# Patient Record
Sex: Female | Born: 1995 | Marital: Single | Smoking: Never smoker
Health system: Southern US, Community
[De-identification: ages and names within clinical notes are randomized; demographics above are authoritative.]

## PROBLEM LIST (undated history)

## (undated) DIAGNOSIS — E049 Nontoxic goiter, unspecified: Secondary | ICD-10-CM

## (undated) DIAGNOSIS — L80 Vitiligo: Secondary | ICD-10-CM

## (undated) HISTORY — PX: ADENOIDECTOMY: SHX5191

## (undated) HISTORY — DX: Vitiligo: L80

## (undated) HISTORY — PX: ANKLE SURGERY: SHX546

## (undated) HISTORY — DX: Nontoxic goiter, unspecified: E04.9

---
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
## 2014-02-09 ENCOUNTER — Encounter: Payer: Self-pay | Admitting: Internal Medicine

## 2014-02-09 ENCOUNTER — Encounter: Payer: BC Managed Care – PPO | Admitting: Internal Medicine

## 2014-02-09 DIAGNOSIS — Z0289 Encounter for other administrative examinations: Secondary | ICD-10-CM

## 2014-02-09 NOTE — Patient Instructions (Signed)
Health Maintenance - 18-19 Years Old SCHOOL PERFORMANCE After high school, you may attend college or technical or vocational school, enroll in the military, or enter the workforce. PHYSICAL, SOCIAL, AND EMOTIONAL DEVELOPMENT  One hour of regular physical activity daily is recommended. Continue to participate in sports.  Develop your own interests and consider community service or volunteerism.  Make decisions about college and work plans.  Throughout these years, you should assume responsibility for your own health care. Increasing independence is important for you.  You may be exploring your sexual identity. Understand that you should never be in a situation that makes you feel uncomfortable, and tell your partner if you do not want to engage in sexual activity.  Body image may become important to you. Be mindful that eating disorders can develop at this time. Talk to your parents or other caregivers if you have concerns about body image, weight gain, or losing weight.  You may notice mood disturbances, depression, anxiety, attention problems, or trouble with alcohol. Talk to your health care provider if you have concerns about mental illness.  Set limits for yourself and talk with your parents or other caregivers about independent decision making.  Handle conflict without physical violence.  Avoid loud noises which may impair hearing.  Limit television and computer time to 2 hours each day. Individuals who engage in excessive inactivity are more likely to become overweight. RECOMMENDED IMMUNIZATIONS  Influenza vaccine.  All adults should be immunized every year.  All adults, including pregnant women and people with hives-only allergy to eggs, can receive the inactivated influenza (IIV) vaccine.  Adults aged 19-49 years can receive the recombinant influenza (RIV) vaccine. The RIV vaccine does not contain any egg protein.  Tetanus, diphtheria, and acellular pertussis (Td, Tdap)  vaccine.  Pregnant women should receive 1 dose of Tdap vaccine during each pregnancy. The dose should be obtained regardless of the length of time since the last dose. Immunization is preferred during the 27th to 36th week of gestation.  An adult who has not previously received Tdap or who does not know his or her vaccine status should receive 1 dose of Tdap. This initial dose should be followed by tetanus and diphtheria toxoids (Td) booster doses every 10 years.  Adults with an unknown or incomplete history of completing a 3-dose immunization series with Td-containing vaccines should begin or complete a primary immunization series including a Tdap dose.  Adults should receive a Td booster every 10 years.  Varicella vaccine.  An adult without evidence of immunity to varicella should receive 2 doses or a second dose if he or she has previously received 1 dose.  Pregnant females who do not have evidence of immunity should receive the first dose after pregnancy. This first dose should be obtained before leaving the health care facility. The second dose should be obtained 4-8 weeks after the first dose.  Human papillomavirus (HPV) vaccine.  Females aged 13-19 years who have not received the vaccine previously should obtain the 3-dose series.  The vaccine is not recommended for pregnant females. However, pregnancy testing is not needed before receiving a dose. If a female is found to be pregnant after receiving a dose, no treatment is needed. In that case, the remaining doses should be delayed until after the pregnancy.  Males aged 13-19 years who have not received the vaccine previously should receive the 3-dose series. Males aged 22-26 years may be immunized.  Immunization is recommended through the age of 19 years for any   female who has sex with males and did not get any or all doses earlier.  Immunization is recommended for any person with an immunocompromised condition through the age of 19  years if he or she did not get any or all doses earlier.  During the 3-dose series, the second dose should be obtained 4-8 weeks after the first dose. The third dose should be obtained 24 weeks after the first dose and 16 weeks after the second dose.  Measles, mumps, and rubella (MMR) vaccine.  Adults born in 1957 or later should have 1 or more doses of MMR vaccine unless there is a contraindication to the vaccine or there is laboratory evidence of immunity to each of the three diseases.  A routine second dose of MMR vaccine should be obtained at least 28 days after the first dose for students attending postsecondary schools, health care workers, and international travelers.  For females of childbearing age, rubella immunity should be determined. If there is no evidence of immunity, females who are not pregnant should be vaccinated. If there is no evidence of immunity, females who are pregnant should delay immunization until after pregnancy.  Pneumococcal 13-valent conjugate (PCV13) vaccine.  When indicated, a person who is uncertain of his or her immunization history and has no record of immunization should receive the PCV13 vaccine.  An adult aged 19 years or older who has certain medical conditions and has not been previously immunized should receive 1 dose of PCV13 vaccine. This PCV13 should be followed with a dose of pneumococcal polysaccharide (PPSV23) vaccine. The PPSV23 vaccine dose should be obtained at least 8 weeks after the dose of PCV13 vaccine.  An adult aged 19 years or older who has certain medical conditions and previously received 1 or more doses of PPSV23 vaccine should receive 1 dose of PCV13. The PCV13 vaccine dose should be obtained 1 or more years after the last PPSV23 vaccine dose.  Pneumococcal polysaccharide (PPSV23) vaccine.  When PCV13 is also indicated, PCV13 should be obtained first.  An adult younger than age 65 years who has certain medical conditions should be  immunized.  Any person who resides in a long-term care facility should be immunized.  An adult smoker should be immunized.  People with an immunocompromised condition and certain other conditions should receive both PCV13 and PPSV23 vaccines.  People with human immunodeficiency virus (HIV) infection should be immunized as soon as possible after diagnosis.  Immunization during chemotherapy or radiation therapy should be avoided.  Routine use of PPSV23 vaccine is not recommended for American Indians, [place] Natives, or people younger than 65 years unless there are medical conditions that require PPSV23 vaccine.  When indicated, people who have unknown immunization and have no record of immunization should receive PPSV23 vaccine.  One-time revaccination 5 years after the first dose of PPSV23 is recommended for people aged 19-64 years who have chronic kidney failure, nephrotic syndrome, asplenia, or immunocompromised conditions.  Meningococcal vaccine.  Adults with asplenia or persistent complement component deficiencies should receive 2 doses of quadrivalent meningococcal conjugate (MenACWY-D) vaccine. The doses should be obtained at least 2 months apart.  Microbiologists working with certain meningococcal bacteria, military recruits, people at risk during an outbreak, and people who travel to or live in countries with a high rate of meningitis should be immunized.  A first-year college student up through age 19 years who is living in a residence hall should receive a dose if he or she did not receive a dose on   or after his or her 16th birthday.  Adults who have certain high-risk conditions should receive one or more doses of vaccine.  Hepatitis A vaccine.  Adults who wish to be protected from this disease, have certain high-risk conditions, work with hepatitis A-infected animals, work in hepatitis A research labs, or travel to or work in countries with a high rate of hepatitis A should be  immunized.  Adults who were previously unvaccinated and who anticipate close contact with an international adoptee during the first 60 days after arrival in the [place] from a country with a high rate of hepatitis A should be immunized.  Hepatitis B vaccine.  Adults who wish to be protected from this disease, have certain high-risk conditions, may be exposed to blood or other infectious body fluids, are household contacts or sex partners of hepatitis B positive people, are clients or workers in certain care facilities, or travel to or work in countries with a high rate of hepatitis B should be immunized.  Haemophilus influenzae type b (Hib) vaccine.  A previously unvaccinated person with asplenia or sickle cell disease or having a scheduled splenectomy should receive 1 dose of Hib vaccine.  Regardless of previous immunization, a recipient of a hematopoietic stem cell transplant should receive a 3-dose series 6-12 months after his or her successful transplant.  Hib vaccine is not recommended for adults with HIV infection. TESTING  Annual screening for vision and hearing problems is recommended. Vision should be screened at least once between 18-19 years of age.  You may be screened for anemia or tuberculosis.  You should have a blood test to check for high cholesterol.  You should be screened for alcohol and drug use.  If you are sexually active, you may be screened for sexually transmitted infections (STIs), pregnancy, or HIV. You should be screened for STIs if:  Your sexual activity has changed since the last screening test, and you are at an increased risk for chlamydia or gonorrhea. Ask your health care provider if you are at risk.  If you are at an increased risk for hepatitis B, you should be screened for this virus. You are considered at high risk for hepatitis B if you:  Were born in a country where hepatitis B occurs often. Talk with your health care provider about which  countries are considered high risk.  Have parents who were born in a high-risk country and have not received a shot to protect against hepatitis B (hepatitis B vaccine).  Have HIV or AIDS.  Use needles to inject street drugs.  Live with or have sex with someone who has hepatitis B.  Are a man who has sex with other men (MSM).  Get hemodialysis treatment.  Take certain medicines for conditions like cancer, organ transplantation, or autoimmune conditions. NUTRITION   You should:  Have three servings of low-fat milk and dairy products daily. If you do not drink milk or consume dairy products, you should eat calcium-enriched foods, such as juice, bread, or cereal. Dark, leafy greens or canned fish are alternate sources of calcium.  Drink plenty of water. Fruit juice should be limited to 8-12 oz (240-360 mL) each day. Sugary beverages and sodas should be avoided.  Avoid eating foods high in fat, salt, or sugar, such as chips, candy, and cookies.  Avoid fast foods and limit eating out at restaurants.  Try not to skip meals, especially breakfast. You should eat a variety of vegetables, fruits, and lean meats.  Eat meals   together as a family whenever possible. ORAL HEALTH Brush your teeth twice a day and floss at least once a day. You should have two dental exams a year.  SKIN CARE You should wear sunscreen when out in the sun. TALK TO SOMEONE ABOUT:  Precautions against pregnancy, contraception, and sexually transmitted infections.  Taking a prescription medicine daily to prevent HIV infection if you are at risk of being infected with HIV. This is called preexposure prophylaxis (PrEP). You are at risk if you:  Are a female who has sex with other males (MSM).  Are heterosexual and sexually active with more than one partner.  Take drugs by injection.  Are sexually active with a partner who has HIV.  Whether you are at high risk of being infected with HIV. If you choose to begin  PrEP, you should first be tested for HIV. You should then be tested every 3 months for as long as you are taking PrEP.  Drug, tobacco, and alcohol use among your friends or at friends' homes. Smoking tobacco or marijuana and taking drugs have health consequences and may impact your brain development.  Appropriate use of over-the-counter or prescription medicines.  Driving guidelines and riding with friends.  The risks of drinking and driving or boating. Call someone if you have been drinking or using drugs and need a ride. WHAT'S NEXT? Visit your pediatrician or family physician once a year. By young adulthood, you should transition from your pediatrician to a family physician or internal medicine specialist. If you are a female and are sexually active, you may want to begin annual physical exams with a gynecologist. Document Released: 09/20/2006 Document Revised: 06/30/2013 Document Reviewed: 10/10/2006 ExitCare Patient Information 2015 [employer]. This information is not intended to replace advice given to you by your health care provider. Make sure you discuss any questions you have with your health care provider.  

## 2014-02-10 ENCOUNTER — Telehealth: Payer: Self-pay | Admitting: Internal Medicine

## 2014-02-10 NOTE — Telephone Encounter (Signed)
Pt thought wcc was today 02-10-14. Pt no showed 02/09/14. Pt needs wcc by 02-12-14

## 2014-02-10 NOTE — Telephone Encounter (Signed)
And have her come at 1 pm    Friday  And if there ar any forms have her fill out a head of time to save time Have her bring her immunization records she may have ( we only have the ones we did)

## 2014-02-11 NOTE — Telephone Encounter (Signed)
Pt mom is aware 

## 2014-02-12 ENCOUNTER — Encounter: Payer: Self-pay | Admitting: Internal Medicine

## 2014-02-12 ENCOUNTER — Ambulatory Visit (INDEPENDENT_AMBULATORY_CARE_PROVIDER_SITE_OTHER): Payer: BC Managed Care – PPO | Admitting: Internal Medicine

## 2014-02-12 VITALS — BP 100/50 | Temp 98.9°F | Ht 64.25 in | Wt 128.0 lb

## 2014-02-12 DIAGNOSIS — Z0000 Encounter for general adult medical examination without abnormal findings: Secondary | ICD-10-CM

## 2014-02-12 DIAGNOSIS — Z23 Encounter for immunization: Secondary | ICD-10-CM

## 2014-02-12 MED ORDER — CLINDAMYCIN PHOS-BENZOYL PEROX 1.2-5 % EX GEL: 1.0000 "application " | Freq: Two times a day (BID) | CUTANEOUS | Status: DC

## 2014-02-12 NOTE — Progress Notes (Signed)
Document opened and reviewed for  Wellness  but appt  Ns    Had wrong date  rescheduled

## 2014-02-12 NOTE — Patient Instructions (Signed)

## 2014-02-12 NOTE — Progress Notes (Signed)
Subjective:    Carol Conrad comes in for wellness visit : No major change in health status since last visit . Periods ocass skip  4 days  Halting . [employer] major live on campus Considering job  for second semester not first No major injuries sleep 8 hours Negative SA Wears contacts and has been having recurrent conjunctivitis red eyes with antibiotic drops following ophthalmologist doing okay now History of low vitamin D level taking supplement over-the-counter. Drugs Tobacco: No Alcohol: No Drugs: No  Sex Activity: No  Suicide Risk Emotions: Good Depression: No Suicidal: No     Objective:     Filed Vitals:   02/12/14 1310  BP: 100/50  Temp: 98.9 F (37.2 C)  TempSrc: Oral  Height: 5' 4.25" (1.632 m)  Weight: 128 lb (58.06 kg)   Growth parameters are noted  appropriate for age. Physical Exam: Vital signs reviewed WUJ:[id number]:This is a well-developed well-nourished alert cooperative  female who appears her stated age in no acute distress.  HEENT: normocephalic atraumatic , Eyes: PERRL EOM's full, conjunctiva clear, Nares: paten,t no deformity discharge or tenderness., Ears: no deformity EAC's clear TMs with normal landmarks. Mouth: clear OP, no lesions, edema.  Moist mucous membranes. Dentition in adequate repair. NECK: supple without masses,or bruits. Thyroid barely palpable no nodules are noted CHEST/PULM:  Clear to auscultation and percussion breath sounds equal no wheeze , rales or rhonchi. No chest wall deformities or tenderness. Breast no nodules or discharge. Breast: normal by inspection . No dimpling, discharge, masses, tenderness or discharge . CV: PMI is nondisplaced, S1 S2 no gallops, murmurs, rubs. Peripheral pulses are full without delay.No JVD .  ABDOMEN: Bowel sounds normal nontender  No guard or rebound, no hepato splenomegal no CVA tenderness.  No hernia. Extremtities:  No clubbing cyanosis or edema, no acute joint  swelling or redness no focal atrophy no scoliosis NEURO:  Oriented x3, cranial nerves 3-12 appear to be intact, no obvious focal weakness,gait within normal limits no abnormal reflexes or asymmetrical SKIN: No acute rashes normal turgor, color, no bruising or petechiae. Some for head acne PSYCH: Oriented, good eye contact, no obvious depression anxiety, cognition and judgment appear normal. LN: no cervical axillary inguinal adenopathy   Lab Results  Component Value Date   WBC 6.9 06/16/2013   HGB 12.5 06/16/2013   HCT 36.9 06/16/2013   PLT 194.0 06/16/2013   GLUCOSE 95 06/16/2013   CHOL 135 06/16/2013   TRIG 144.0 06/16/2013   HDL 41.80 06/16/2013   LDLCALC 64 06/16/2013   ALT 14 06/16/2013   AST 21 06/16/2013   NA 136 06/16/2013   K 4.0 06/16/2013   CL 102 06/16/2013   CREATININE 0.6 06/16/2013   BUN 12 06/16/2013   CO2 25 06/16/2013   TSH 1.32 06/16/2013   HGBA1C 5.1 02/26/2012       Assessment:  Visit for preventive health examination - Mom went home to get the immunization schedule due for meningitis booster hasn't had hepatitis A vaccine can get it anytime in the future  Need for meningococcal vaccination - Plan: Meningococcal conjugate vaccine 4-valent IM    History of acne irregular periods but getting better. Plan:   1. Anticipatory guidance discussed. Nutrition and Physical activity transition to college. Continue healthy lifestyle sleep activity etc. She can continue the vitamin D supplement at her next blood draw we can check another level but not urgent if she is replenishing. Hep a when wishes  2. Follow-up visit in  12 months for next wellness visit, or sooner as needed.

## 2014-08-30 ENCOUNTER — Telehealth: Payer: Self-pay | Admitting: Internal Medicine

## 2014-08-30 NOTE — Telephone Encounter (Signed)
FYI: Patient's mother is going to bring a form here for Dr. Fabian SharpPanosh to complete instead of a callback regarding her TB skin test.

## 2014-10-25 ENCOUNTER — Telehealth: Payer: Self-pay | Admitting: Internal Medicine

## 2014-10-25 DIAGNOSIS — D8989 Other specified disorders involving the immune mechanism, not elsewhere classified: Secondary | ICD-10-CM

## 2014-10-25 NOTE — Telephone Encounter (Signed)
I'm ok with doing referral      She has an undefined autoimmune dx  Pos serologies  Previously evaluated  By rheumatology at [employer] [facility] [place] [place]  As a teen.   Those records are  In the media tab

## 2014-10-25 NOTE — Telephone Encounter (Signed)
Please see message and advise if referral okay?

## 2014-10-25 NOTE — Telephone Encounter (Addendum)
Pt's mom called to get a referral for a rheumatologist at [facility] [place],  Pt is in school there.  Pt has ongoing joint pain, Auto immune def, and mom states dr Fabian Sharppanosh is aware.  Pt has been experience swelling in thumb, index finger, right hand. Mom would like pt to see someone there due to pt being in school  [facility] [phone] Mom called there this am and they are scheduling appt into august  Mom states ok to call her back

## 2014-10-25 NOTE — Telephone Encounter (Signed)
Referral placed.

## 2014-11-01 ENCOUNTER — Telehealth: Payer: Self-pay | Admitting: Internal Medicine

## 2014-11-01 DIAGNOSIS — R251 Tremor, unspecified: Secondary | ICD-10-CM

## 2014-11-01 NOTE — Telephone Encounter (Signed)
Spoke to Anu (mother).  She states the pt has been having tremors in her left hand.  Was concerned her thyroid may be off.  [facility] and they will check thyroid today at 2:20.  Mother would like a referral to neuro in [place] area for tremors.  Please advise.  Thanks!

## 2014-11-01 NOTE — Telephone Encounter (Signed)
Ok to refer  If needed .by [facility] may also be able to do this

## 2014-11-01 NOTE — Telephone Encounter (Signed)
Mom would like a call back she has questions about thyroid level test

## 2014-11-02 NOTE — Telephone Encounter (Signed)
Referral placed.

## 2014-11-09 ENCOUNTER — Encounter: Payer: Self-pay | Admitting: Family Medicine

## 2015-02-22 ENCOUNTER — Encounter: Payer: Self-pay | Admitting: Internal Medicine

## 2015-02-22 ENCOUNTER — Ambulatory Visit (INDEPENDENT_AMBULATORY_CARE_PROVIDER_SITE_OTHER): Payer: BLUE CROSS/BLUE SHIELD | Admitting: Internal Medicine

## 2015-02-22 DIAGNOSIS — Z0000 Encounter for general adult medical examination without abnormal findings: Secondary | ICD-10-CM

## 2015-02-22 DIAGNOSIS — E049 Nontoxic goiter, unspecified: Secondary | ICD-10-CM

## 2015-02-22 NOTE — Progress Notes (Signed)
Pre visit review using our clinic review tool, if applicable. No additional management support is needed unless otherwise documented below in the visit note.                Chief Complaint  Patient presents with  . Annual Exam    HPI: Patient  Carol Conrad  19 y.o. 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 [facility] and then by [facility]ology at [facility] for ankle pain and hand pain. Was not felt to be rheumatologic comes referred to orthopedics for posteriorly-year-old tibial tendinitis probable. Had labs  Thyroid ok. Uncertain if she had a lipid panel. Otherwise she is feeling fine no depression new skin areas. Also has had a tremor more in her left hand nonprogressive is to see neurologist. No dropping things or weakness no significant caffeine alcohol.   Health Maintenance  Topic Date Due  . HIV Screening  03/18/2010  . PAP SMEAR  03/18/2013  . TETANUS/TDAP  03/18/2014  . INFLUENZA VACCINE  02/07/2015   Health Maintenance Review LIFESTYLE:  Exercise:  Yes Tobacco/ETS: No no Alcohol: no Sugar beverages:no Sleep:8 hours Drug use: no No depression anxiety violence  Periods normal getting reg and no problem  ROS:  GEN/ HEENT: No fever, significant weight changes sweats headaches vision problems hearing changes, CV/ PULM; No chest pain shortness of breath cough, syncope,edema  change in exercise tolerance. GI /GU: No adominal pain, vomiting, change in bowel habits. No blood in the stool. No significant GU symptoms. SKIN/HEME: ,no acute skin rashes suspicious lesions or bleeding. No lymphadenopathy, nodules, masses.  NEURO/ PSYCH:  No neurologic signs such as weakness numbness. No depression anxiety. IMM/ Allergy: No unusual infections.  Allergy .   REST of 12 system review negative except as per HPI   Past Medical History  Diagnosis Date  . Vitiligo     derm evaluation  using herval remedy [place] oral and topical  . Goiter     borderlne abn tests  following    Past Surgical History  Procedure Laterality Date  . Adenoidectomy  Age 7  . Ankle surgery  4th Grade    left navicular     Family History  Problem Relation Age of Onset  . Eczema Father   . Hypertension Father   . Hyperlipidemia Father     Elevated triglycerides  . Eczema Sister   . Hypertension Maternal Grandmother     Social History   Social History  . Marital Status: Single    Spouse Name: N/A  . Number of Children: N/A  . Years of Education: N/A   Social History Main Topics  . Smoking status: Never Smoker   . Smokeless tobacco: Not on file  . Alcohol Use: No  . Drug Use: Not on file  . Sexual Activity: No   Other Topics Concern  . 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 Moved from [place]   Parents Anu and Johny Chess   Masters level edu father Chief Financial Officer    Outpatient Prescriptions Prior to Visit  Medication Sig Dispense Refill  . Clindamycin-Benzoyl Per, Refr, (DUAC) gel Apply 1 application topically 2 (two) times daily. For acne 45 g  3   No facility-administered medications prior to visit.     EXAM:  There were no vitals taken for this visit.  There is no weight on file to calculate BMI.  Physical Exam: Vital signs reviewed ACZ:YSAY is a well-developed well-nourished alert cooperative    who appearsr stated age in no acute distress.  HEENT: normocephalic atraumatic , Eyes: PERRL EOM's full, conjunctiva clear, Nares: paten,t no deformity discharge or tenderness., Ears: no deformity EAC's clear TMs with normal landmarks. Mouth: clear OP, no lesions, edema.  Moist mucous membranes. Dentition in adequate repair. NECK: supple without masses, thyroid easily palpable  or bruits. CHEST/PULM:  Clear to  auscultation and percussion breath sounds equal no wheeze , rales or rhonchi. No chest wall deformities or tenderness. CV: PMI is nondisplaced, S1 S2 no gallops, murmurs, rubs. Peripheral pulses are full without delay.No JVD . Breast: normal by inspection . No dimpling, discharge, masses, tenderness or discharge . ABDOMEN: Bowel sounds normal nontender  No guard or rebound, no hepato splenomegal no CVA tenderness.  No hernia. Extremtities:  No clubbing cyanosis or edema, no acute joint swelling or redness no focal atrophysome pronation no edema  NEURO:  Oriented x3, cranial nerves 3-12 appear to be intact, no obvious focal weakness,gait within normal limits no abnormal reflexes or asymmetrical no obv tremor today  SKIN: No acute rashes normal turgor, color, no bruising or petechiae. Some papular acne  PSYCH: Oriented, good eye contact, no obvious depression anxiety, cognition and judgment appear normal. LN: no cervical axillary inguinal adenopathy  Lab Results  Component Value Date   WBC 6.9 06/16/2013   HGB 12.5 06/16/2013   HCT 36.9 06/16/2013   PLT 194.0 06/16/2013   GLUCOSE 95 06/16/2013   CHOL 135 06/16/2013   TRIG 144.0 06/16/2013   HDL 41.80 06/16/2013   LDLCALC 64 06/16/2013   ALT 14 06/16/2013   AST 21 06/16/2013   NA 136 06/16/2013   K 4.0 06/16/2013   CL 102 06/16/2013   CREATININE 0.6 06/16/2013   BUN 12 06/16/2013   CO2 25 06/16/2013   TSH 1.32 06/16/2013   HGBA1C 5.1 02/26/2012    ASSESSMENT AND PLAN:  Discussed the following assessment and plan:  Visit for preventive health examination  Goiter - nl tfts yearly  Hx tremor  Poss essemtial to get neuro eval Ankle  Issues prob tenditinit with overuse syndrome  No rheum disease noted by rheumatology recently patient states she has had recent thyroid test her lipids been normal in 2014. Immunizations are up-to-date. Counseled regarding healthy nutrition, exercise, sleep, injury prevention, calcium vit d and healthy  weight .  Patient Care Team: Burnis Medin, MD as PCP - General (Internal Medicine) Lelon Huh, MD (Pediatric Endocrinology) Patient Instructions   Make sure Get enough sleep . I agree fu with ortho or sports medicine about the poss tendinitis .      Health Maintenance - 50-7 Years Old SCHOOL PERFORMANCE After high school, you may attend college or technical or vocational school, enroll in the [employer], or enter the workforce. PHYSICAL, SOCIAL, AND EMOTIONAL DEVELOPMENT  One hour of regular physical activity daily is recommended. Continue to participate in sports.  Develop your own interests and consider community service or volunteerism.  Make decisions about college and work plans.  Throughout these years, you should assume responsibility for your own health care. Increasing independence is important for you.  You may be exploring your sexual identity. Understand that you should never be in a situation  that makes you feel uncomfortable, and tell your partner if you do not want to engage in sexual activity.  Body image may become important to you. Be mindful that eating disorders can develop at this time. Talk to your parents or other caregivers if you have concerns about body image, weight gain, or losing weight.  You may notice mood disturbances, depression, anxiety, attention problems, or trouble with alcohol. Talk to your health care provider if you have concerns about mental illness.  Set limits for yourself and talk with your parents or other caregivers about independent decision making.  Handle conflict without physical violence.  Avoid loud noises which may impair hearing.  Limit television and computer time to 2 hours each day. Individuals who engage in excessive inactivity are more likely to become overweight. RECOMMENDED IMMUNIZATIONS  Influenza vaccine.  All adults should be immunized every year.  All adults, including pregnant women and people with  hives-only allergy to eggs, can receive the inactivated influenza (IIV) vaccine.  Adults aged 18-49 years can receive the recombinant influenza (RIV) vaccine. The RIV vaccine does not contain any egg protein.  Tetanus, diphtheria, and acellular pertussis (Td, Tdap) vaccine.  Pregnant women should receive 1 dose of Tdap vaccine during each pregnancy. The dose should be obtained regardless of the length of time since the last dose. Immunization is preferred during the 27th to 36th week of gestation.  An adult who has not previously received Tdap or who does not know his or her vaccine status should receive 1 dose of Tdap. This initial dose should be followed by tetanus and diphtheria toxoids (Td) booster doses every 10 years.  Adults with an unknown or incomplete history of completing a 3-dose immunization series with Td-containing vaccines should begin or complete a primary immunization series including a Tdap dose.  Adults should receive a Td booster every 10 years.  Varicella vaccine.  An adult without evidence of immunity to varicella should receive 2 doses or a second dose if he or she has previously received 1 dose.  Pregnant females who do not have evidence of immunity should receive the first dose after pregnancy. This first dose should be obtained before leaving the health care facility. The second dose should be obtained 4-8 weeks after the first dose.  Human papillomavirus (HPV) vaccine.  Females aged 13-26 years who have not received the vaccine previously should obtain the 3-dose series.  The vaccine is not recommended for pregnant females. However, pregnancy testing is not needed before receiving a dose. If a female is found to be pregnant after receiving a dose, no treatment is needed. In that case, the remaining doses should be delayed until after the pregnancy.  Males aged 81-21 years who have not received the vaccine previously should receive the 3-dose series. Males aged  22-26 years may be immunized.  Immunization is recommended through the age of 91 years for any female who has sex with males and did not get any or all doses earlier.  Immunization is recommended for any person with an immunocompromised condition through the age of 72 years if he or she did not get any or all doses earlier.  During the 3-dose series, the second dose should be obtained 4-8 weeks after the first dose. The third dose should be obtained 24 weeks after the first dose and 16 weeks after the second dose.  Measles, mumps, and rubella (MMR) vaccine.  Adults born in [birth year] or later should have 1 or more doses of MMR  vaccine unless there is a contraindication to the vaccine or there is laboratory evidence of immunity to each of the three diseases.  A routine second dose of MMR vaccine should be obtained at least 28 days after the first dose for students attending postsecondary schools, health care workers, and international travelers.  For females of childbearing age, rubella immunity should be determined. If there is no evidence of immunity, females who are not pregnant should be vaccinated. If there is no evidence of immunity, females who are pregnant should delay immunization until after pregnancy.  Pneumococcal 13-valent conjugate (PCV13) vaccine.  When indicated, a person who is uncertain of his or her immunization history and has no record of immunization should receive the PCV13 vaccine.  An adult aged 96 years or older who has certain medical conditions and has not been previously immunized should receive 1 dose of PCV13 vaccine. This PCV13 should be followed with a dose of pneumococcal polysaccharide (PPSV23) vaccine. The PPSV23 vaccine dose should be obtained at least 8 weeks after the dose of PCV13 vaccine.  An adult aged 70 years or older who has certain medical conditions and previously received 1 or more doses of PPSV23 vaccine should receive 1 dose of PCV13. The PCV13 vaccine  dose should be obtained 1 or more years after the last PPSV23 vaccine dose.  Pneumococcal polysaccharide (PPSV23) vaccine.  When PCV13 is also indicated, PCV13 should be obtained first.  An adult younger than age 75 years who has certain medical conditions should be immunized.  Any person who resides in a long-term care facility should be immunized.  An adult smoker should be immunized.  People with an immunocompromised condition and certain other conditions should receive both PCV13 and PPSV23 vaccines.  People with human immunodeficiency virus (HIV) infection should be immunized as soon as possible after diagnosis.  Immunization during chemotherapy or radiation therapy should be avoided.  Routine use of PPSV23 vaccine is not recommended for American Indians, [place] Natives, or people younger than 65 years unless there are medical conditions that require PPSV23 vaccine.  When indicated, people who have unknown immunization and have no record of immunization should receive PPSV23 vaccine.  One-time revaccination 5 years after the first dose of PPSV23 is recommended for people aged 19-64 years who have chronic kidney failure, nephrotic syndrome, asplenia, or immunocompromised conditions.  Meningococcal vaccine.  Adults with asplenia or persistent complement component deficiencies should receive 2 doses of quadrivalent meningococcal conjugate (MenACWY-D) vaccine. The doses should be obtained at least 2 months apart.  Microbiologists working with certain meningococcal bacteria, [employer] recruits, people at risk during an outbreak, and people who travel to or live in countries with a high rate of meningitis should be immunized.  A first-year college student up through age 36 years who is living in a residence hall should receive a dose if he or she did not receive a dose on or after his or her 16th birthday.  Adults who have certain high-risk conditions should receive one or more doses of  vaccine.  Hepatitis A vaccine.  Adults who wish to be protected from this disease, have certain high-risk conditions, work with hepatitis A-infected animals, work in hepatitis A research labs, or travel to or work in countries with a high rate of hepatitis A should be immunized.  Adults who were previously unvaccinated and who anticipate close contact with an international adoptee during the first 60 days after arrival in the [place] from a country with a high rate of  hepatitis A should be immunized.  Hepatitis B vaccine.  Adults who wish to be protected from this disease, have certain high-risk conditions, may be exposed to blood or other infectious body fluids, are household contacts or sex partners of hepatitis B positive people, are clients or workers in certain care facilities, or travel to or work in countries with a high rate of hepatitis B should be immunized.  Haemophilus influenzae type b (Hib) vaccine.  A previously unvaccinated person with asplenia or sickle cell disease or having a scheduled splenectomy should receive 1 dose of Hib vaccine.  Regardless of previous immunization, a recipient of a hematopoietic stem cell transplant should receive a 3-dose series 6-12 months after his or her successful transplant.  Hib vaccine is not recommended for adults with HIV infection. TESTING  Annual screening for vision and hearing problems is recommended. Vision should be screened at least once between 105-12 years of age.  You may be screened for anemia or tuberculosis.  You should have a blood test to check for high cholesterol.  You should be screened for alcohol and drug use.  If you are sexually active, you may be screened for sexually transmitted infections (STIs), pregnancy, or HIV. You should be screened for STIs if:  Your sexual activity has changed since the last screening test, and you are at an increased risk for chlamydia or gonorrhea. Ask your health care provider  if you are at risk.  If you are at an increased risk for hepatitis B, you should be screened for this virus. You are considered at high risk for hepatitis B if you:  Were born in a country where hepatitis B occurs often. Talk with your health care provider about which countries are considered high risk.  Have parents who were born in a high-risk country and have not received a shot to protect against hepatitis B (hepatitis B vaccine).  Have HIV or AIDS.  Use needles to inject street drugs.  Live with or have sex with someone who has hepatitis B.  Are a man who has sex with other men (MSM).  Get hemodialysis treatment.  Take certain medicines for conditions like cancer, organ transplantation, or autoimmune conditions. NUTRITION   You should:  Have three servings of low-fat milk and dairy products daily. If you do not drink milk or consume dairy products, you should eat calcium-enriched foods, such as juice, bread, or cereal. Dark, leafy greens or canned fish are alternate sources of calcium.  Drink plenty of water. Fruit juice should be limited to 8-12 oz (240-360 mL) each day. Sugary beverages and sodas should be avoided.  Avoid eating foods high in fat, salt, or sugar, such as chips, candy, and cookies.  Avoid fast foods and limit eating out at restaurants.  Try not to skip meals, especially breakfast. You should eat a variety of vegetables, fruits, and lean meats.  Eat meals together as a family whenever possible. ORAL HEALTH Brush your teeth twice a day and floss at least once a day. You should have two dental exams a year.  SKIN CARE You should wear sunscreen when out in the sun. TALK TO SOMEONE ABOUT:  Precautions against pregnancy, contraception, and sexually transmitted infections.  Taking a prescription medicine daily to prevent HIV infection if you are at risk of being infected with HIV. This is called preexposure prophylaxis (PrEP). You are at risk if you:  Are a  female who has sex with other males (MSM).  Are heterosexual and sexually  active with more than one partner.  Take drugs by injection.  Are sexually active with a partner who has HIV.  Whether you are at high risk of being infected with HIV. If you choose to begin PrEP, you should first be tested for HIV. You should then be tested every 3 months for as long as you are taking PrEP.  Drug, tobacco, and alcohol use among your friends or at friends' homes. Smoking tobacco or marijuana and taking drugs have health consequences and may impact your brain development.  Appropriate use of over-the-counter or prescription medicines.  Driving guidelines and riding with friends.  The risks of drinking and driving or boating. Call someone if you have been drinking or using drugs and need a ride. WHAT'S NEXT? Visit your pediatrician or family physician once a year. By young adulthood, you should transition from your pediatrician to a family physician or internal medicine specialist. If you are a female and are sexually active, you may want to begin annual physical exams with a gynecologist. Document Released: 09/20/2006 Document Revised: 06/30/2013 Document Reviewed: 10/10/2006 [facility] Patient Information 2015 [place], [place]. This information is not intended to replace advice given to you by your health care provider. Make sure you discuss any questions you have with your health care provider.     Standley Brooking. Muneeb Veras M.D.

## 2015-02-22 NOTE — Patient Instructions (Signed)
Make sure Get enough sleep . I agree fu with ortho or sports medicine about the poss tendinitis .      Health Maintenance - 21-20 Years Old SCHOOL PERFORMANCE After high school, you may attend college or technical or vocational school, enroll in the [employer], or enter the workforce. PHYSICAL, SOCIAL, AND EMOTIONAL DEVELOPMENT  One hour of regular physical activity daily is recommended. Continue to participate in sports.  Develop your own interests and consider community service or volunteerism.  Make decisions about college and work plans.  Throughout these years, you should assume responsibility for your own health care. Increasing independence is important for you.  You may be exploring your sexual identity. Understand that you should never be in a situation that makes you feel uncomfortable, and tell your partner if you do not want to engage in sexual activity.  Body image may become important to you. Be mindful that eating disorders can develop at this time. Talk to your parents or other caregivers if you have concerns about body image, weight gain, or losing weight.  You may notice mood disturbances, depression, anxiety, attention problems, or trouble with alcohol. Talk to your health care provider if you have concerns about mental illness.  Set limits for yourself and talk with your parents or other caregivers about independent decision making.  Handle conflict without physical violence.  Avoid loud noises which may impair hearing.  Limit television and computer time to 2 hours each day. Individuals who engage in excessive inactivity are more likely to become overweight. RECOMMENDED IMMUNIZATIONS  Influenza vaccine.  All adults should be immunized every year.  All adults, including pregnant women and people with hives-only allergy to eggs, can receive the inactivated influenza (IIV) vaccine.  Adults aged 18-49 years can receive the recombinant influenza (RIV) vaccine.  The RIV vaccine does not contain any egg protein.  Tetanus, diphtheria, and acellular pertussis (Td, Tdap) vaccine.  Pregnant women should receive 1 dose of Tdap vaccine during each pregnancy. The dose should be obtained regardless of the length of time since the last dose. Immunization is preferred during the 27th to 36th week of gestation.  An adult who has not previously received Tdap or who does not know his or her vaccine status should receive 1 dose of Tdap. This initial dose should be followed by tetanus and diphtheria toxoids (Td) booster doses every 10 years.  Adults with an unknown or incomplete history of completing a 3-dose immunization series with Td-containing vaccines should begin or complete a primary immunization series including a Tdap dose.  Adults should receive a Td booster every 10 years.  Varicella vaccine.  An adult without evidence of immunity to varicella should receive 2 doses or a second dose if he or she has previously received 1 dose.  Pregnant females who do not have evidence of immunity should receive the first dose after pregnancy. This first dose should be obtained before leaving the health care facility. The second dose should be obtained 4-8 weeks after the first dose.  Human papillomavirus (HPV) vaccine.  Females aged 13-26 years who have not received the vaccine previously should obtain the 3-dose series.  The vaccine is not recommended for pregnant females. However, pregnancy testing is not needed before receiving a dose. If a female is found to be pregnant after receiving a dose, no treatment is needed. In that case, the remaining doses should be delayed until after the pregnancy.  Males aged 41-21 years who have not received the vaccine previously  should receive the 3-dose series. Males aged 22-26 years may be immunized.  Immunization is recommended through the age of 10 years for any female who has sex with males and did not get any or all doses  earlier.  Immunization is recommended for any person with an immunocompromised condition through the age of 3 years if he or she did not get any or all doses earlier.  During the 3-dose series, the second dose should be obtained 4-8 weeks after the first dose. The third dose should be obtained 24 weeks after the first dose and 16 weeks after the second dose.  Measles, mumps, and rubella (MMR) vaccine.  Adults born in [birth year] or later should have 1 or more doses of MMR vaccine unless there is a contraindication to the vaccine or there is laboratory evidence of immunity to each of the three diseases.  A routine second dose of MMR vaccine should be obtained at least 28 days after the first dose for students attending postsecondary schools, health care workers, and international travelers.  For females of childbearing age, rubella immunity should be determined. If there is no evidence of immunity, females who are not pregnant should be vaccinated. If there is no evidence of immunity, females who are pregnant should delay immunization until after pregnancy.  Pneumococcal 13-valent conjugate (PCV13) vaccine.  When indicated, a person who is uncertain of his or her immunization history and has no record of immunization should receive the PCV13 vaccine.  An adult aged 9 years or older who has certain medical conditions and has not been previously immunized should receive 1 dose of PCV13 vaccine. This PCV13 should be followed with a dose of pneumococcal polysaccharide (PPSV23) vaccine. The PPSV23 vaccine dose should be obtained at least 8 weeks after the dose of PCV13 vaccine.  An adult aged 74 years or older who has certain medical conditions and previously received 1 or more doses of PPSV23 vaccine should receive 1 dose of PCV13. The PCV13 vaccine dose should be obtained 1 or more years after the last PPSV23 vaccine dose.  Pneumococcal polysaccharide (PPSV23) vaccine.  When PCV13 is also indicated,  PCV13 should be obtained first.  An adult younger than age 52 years who has certain medical conditions should be immunized.  Any person who resides in a long-term care facility should be immunized.  An adult smoker should be immunized.  People with an immunocompromised condition and certain other conditions should receive both PCV13 and PPSV23 vaccines.  People with human immunodeficiency virus (HIV) infection should be immunized as soon as possible after diagnosis.  Immunization during chemotherapy or radiation therapy should be avoided.  Routine use of PPSV23 vaccine is not recommended for American Indians, [place] Natives, or people younger than 65 years unless there are medical conditions that require PPSV23 vaccine.  When indicated, people who have unknown immunization and have no record of immunization should receive PPSV23 vaccine.  One-time revaccination 5 years after the first dose of PPSV23 is recommended for people aged 19-64 years who have chronic kidney failure, nephrotic syndrome, asplenia, or immunocompromised conditions.  Meningococcal vaccine.  Adults with asplenia or persistent complement component deficiencies should receive 2 doses of quadrivalent meningococcal conjugate (MenACWY-D) vaccine. The doses should be obtained at least 2 months apart.  Microbiologists working with certain meningococcal bacteria, [employer], people at risk during an outbreak, and people who travel to or live in countries with a high rate of meningitis should be immunized.  A first-year college student up through  age 22 years who is living in a residence hall should receive a dose if he or she did not receive a dose on or after his or her 19th birthday.  Adults who have certain high-risk conditions should receive one or more doses of vaccine.  Hepatitis A vaccine.  Adults who wish to be protected from this disease, have certain high-risk conditions, work with hepatitis A-infected  animals, work in hepatitis A research labs, or travel to or work in countries with a high rate of hepatitis A should be immunized.  Adults who were previously unvaccinated and who anticipate close contact with an international adoptee during the first 60 days after arrival in the [place] from a country with a high rate of hepatitis A should be immunized.  Hepatitis B vaccine.  Adults who wish to be protected from this disease, have certain high-risk conditions, may be exposed to blood or other infectious body fluids, are household contacts or sex partners of hepatitis B positive people, are clients or workers in certain care facilities, or travel to or work in countries with a high rate of hepatitis B should be immunized.  Haemophilus influenzae type b (Hib) vaccine.  A previously unvaccinated person with asplenia or sickle cell disease or having a scheduled splenectomy should receive 1 dose of Hib vaccine.  Regardless of previous immunization, a recipient of a hematopoietic stem cell transplant should receive a 3-dose series 6-12 months after his or her successful transplant.  Hib vaccine is not recommended for adults with HIV infection. TESTING  Annual screening for vision and hearing problems is recommended. Vision should be screened at least once between 61-77 years of age.  You may be screened for anemia or tuberculosis.  You should have a blood test to check for high cholesterol.  You should be screened for alcohol and drug use.  If you are sexually active, you may be screened for sexually transmitted infections (STIs), pregnancy, or HIV. You should be screened for STIs if:  Your sexual activity has changed since the last screening test, and you are at an increased risk for chlamydia or gonorrhea. Ask your health care provider if you are at risk.  If you are at an increased risk for hepatitis B, you should be screened for this virus. You are considered at high risk for  hepatitis B if you:  Were born in a country where hepatitis B occurs often. Talk with your health care provider about which countries are considered high risk.  Have parents who were born in a high-risk country and have not received a shot to protect against hepatitis B (hepatitis B vaccine).  Have HIV or AIDS.  Use needles to inject street drugs.  Live with or have sex with someone who has hepatitis B.  Are a man who has sex with other men (MSM).  Get hemodialysis treatment.  Take certain medicines for conditions like cancer, organ transplantation, or autoimmune conditions. NUTRITION   You should:  Have three servings of low-fat milk and dairy products daily. If you do not drink milk or consume dairy products, you should eat calcium-enriched foods, such as juice, bread, or cereal. Dark, leafy greens or canned fish are alternate sources of calcium.  Drink plenty of water. Fruit juice should be limited to 8-12 oz (240-360 mL) each day. Sugary beverages and sodas should be avoided.  Avoid eating foods high in fat, salt, or sugar, such as chips, candy, and cookies.  Avoid fast foods and limit eating out  at restaurants.  Try not to skip meals, especially breakfast. You should eat a variety of vegetables, fruits, and lean meats.  Eat meals together as a family whenever possible. ORAL HEALTH Brush your teeth twice a day and floss at least once a day. You should have two dental exams a year.  SKIN CARE You should wear sunscreen when out in the sun. TALK TO SOMEONE ABOUT:  Precautions against pregnancy, contraception, and sexually transmitted infections.  Taking a prescription medicine daily to prevent HIV infection if you are at risk of being infected with HIV. This is called preexposure prophylaxis (PrEP). You are at risk if you:  Are a female who has sex with other males (MSM).  Are heterosexual and sexually active with more than one partner.  Take drugs by injection.  Are  sexually active with a partner who has HIV.  Whether you are at high risk of being infected with HIV. If you choose to begin PrEP, you should first be tested for HIV. You should then be tested every 3 months for as long as you are taking PrEP.  Drug, tobacco, and alcohol use among your friends or at friends' homes. Smoking tobacco or marijuana and taking drugs have health consequences and may impact your brain development.  Appropriate use of over-the-counter or prescription medicines.  Driving guidelines and riding with friends.  The risks of drinking and driving or boating. Call someone if you have been drinking or using drugs and need a ride. WHAT'S NEXT? Visit your pediatrician or family physician once a year. By young adulthood, you should transition from your pediatrician to a family physician or internal medicine specialist. If you are a female and are sexually active, you may want to begin annual physical exams with a gynecologist. Document Released: 09/20/2006 Document Revised: 06/30/2013 Document Reviewed: 10/10/2006 [facility] Patient Information 2015 [place], [place]. This information is not intended to replace advice given to you by your health care provider. Make sure you discuss any questions you have with your health care provider.

## 2015-06-17 ENCOUNTER — Telehealth: Payer: Self-pay | Admitting: Internal Medicine

## 2015-06-17 NOTE — Telephone Encounter (Signed)
Spoke to patient's mom.  Advised that Carol Conrad is eligible for the Bexsero vaccine (2 dose given one month apart).  Then that will complete her mening vaccines.  Sent to scheduling to make an appt.

## 2015-06-17 NOTE — Telephone Encounter (Signed)
Pt's mother called asking about the Meningitis vaccine wondering if we have the most updated version that includes all 5 portions. She'd like a phone call in regards to this.   Carol Conrad's ph# [phone] Thank you.

## 2015-07-05 ENCOUNTER — Ambulatory Visit: Payer: BLUE CROSS/BLUE SHIELD | Admitting: Family Medicine

## 2015-09-19 ENCOUNTER — Ambulatory Visit (INDEPENDENT_AMBULATORY_CARE_PROVIDER_SITE_OTHER): Payer: BLUE CROSS/BLUE SHIELD | Admitting: Family Medicine

## 2015-09-19 DIAGNOSIS — Z23 Encounter for immunization: Secondary | ICD-10-CM

## 2015-11-15 ENCOUNTER — Ambulatory Visit (INDEPENDENT_AMBULATORY_CARE_PROVIDER_SITE_OTHER): Payer: BLUE CROSS/BLUE SHIELD | Admitting: Family Medicine

## 2015-11-15 DIAGNOSIS — Z23 Encounter for immunization: Secondary | ICD-10-CM

## 2016-04-24 DIAGNOSIS — J029 Acute pharyngitis, unspecified: Secondary | ICD-10-CM | POA: Diagnosis not present

## 2016-06-11 DIAGNOSIS — Z23 Encounter for immunization: Secondary | ICD-10-CM | POA: Diagnosis not present

## 2016-08-03 DIAGNOSIS — R05 Cough: Secondary | ICD-10-CM | POA: Diagnosis not present

## 2016-08-03 DIAGNOSIS — H1045 Other chronic allergic conjunctivitis: Secondary | ICD-10-CM | POA: Diagnosis not present

## 2017-05-10 NOTE — Progress Notes (Signed)
Chief Complaint  Patient presents with  . Annual Exam    No new concerns    HPI: Patient  Carol Conrad  21 y.o. comes in today for Preventive Health Care visit  Last pv 2016  senior at school  No major change in health but would like thyroid rechecked  Hx of borderline readings and ? If hashimotos in past .  Had mood change on ocps now not using   Health Maintenance  Topic Date Due  . HIV Screening  03/18/2010  . TETANUS/TDAP  03/18/2014  . INFLUENZA VACCINE  02/06/2017  . PAP SMEAR  01/07/2020   Health Maintenance Review LIFESTYLE:  Exercise:   About 90 min per day ocass ankle pain.  Tobacco/ETS: no Alcohol:   Couple on weekend  Sugar beverages:n Sleep: 5-8 hours  Drug use: no    HH of   Home 4    House  3  [facility]   Senior  .   considering  Nursing.   Reg  Periods   New  bc and had   Mood issues  belsovi  2 weeks  Depressive.  Counseling   Doing ok now      ROS:  Wears inserts to help    GEN/ HEENT: No fever, significant weight changes sweats headaches vision problems hearing changes, CV/ PULM; No chest pain shortness of breath cough, syncope,edema  change in exercise tolerance. GI /GU: No adominal pain, vomiting, change in bowel habits. No blood in the stool. No significant GU symptoms. SKIN/HEME: ,no acute skin rashes suspicious lesions or bleeding. No lymphadenopathy, nodules, masses.  NEURO/ PSYCH:  No neurologic signs such as weakness numbness. No depression anxiety. IMM/ Allergy: No unusual infections.  Allergy .   REST of 12 system review negative except as per HPI   Past Medical History:  Diagnosis Date  . Goiter    borderlne abn tests  following  . Vitiligo    derm evaluation using herval remedy [place] oral and topical    Past Surgical History:  Procedure Laterality Date  . ADENOIDECTOMY  Age 60  . ANKLE SURGERY  4th Grade   left navicular     Family History  Problem Relation Age of Onset  . Eczema Father   . Hypertension Father   .  Hyperlipidemia Father        Elevated triglycerides  . Eczema Sister   . Hypertension Maternal Grandmother     Social History   Socioeconomic History  . Marital status: Single    Spouse name: Not on file  . Number of children: Not on file  . Years of education: Not on file  . Highest education level: Not on file  Social Needs  . Financial resource strain: Not on file  . Food insecurity - worry: Not on file  . Food insecurity - inability: Not on file  . Transportation needs - medical: Not on file  . Transportation needs - non-medical: Not on file  Occupational History  . Not on file  Tobacco Use  . Smoking status: Never Smoker  . Smokeless tobacco: Never Used  Substance and Sexual Activity  . Alcohol use: No  . Drug use: Not on file  . Sexual activity: No  Other Topics Concern  . 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 Moved from [place]   Parents Carol Conrad and Carol Conrad Chess   Masters level edu father Chief Financial Officer    Outpatient Medications Prior to Visit  Medication Sig Dispense Refill  . Clindamycin-Benzoyl Per, Refr, (DUAC) gel Apply 1 application topically 2 (two) times daily. For acne 45 g 3   No facility-administered medications prior to visit.      EXAM:  BP 94/64 (BP Location: Right Arm, Patient Position: Sitting, Cuff Size: Normal)   Pulse 71   Temp 98 F (36.7 C) (Oral)   Ht 5' 4.25" (1.632 m)   Wt 140 lb (63.5 kg)   BMI 23.84 kg/m   Body mass index is 23.84 kg/m. Wt Readings from Last 3 Encounters:  05/13/17 140 lb (63.5 kg)  02/12/14 128 lb (58.1 kg) (54 %, Z= 0.09)*  09/08/13 125 lb (56.7 kg) (50 %, Z= 0.00)*   * Growth percentiles are based on CDC (Girls, 2-20 Years) data.    Physical Exam: Vital signs reviewed HRC:BULA is a well-developed well-nourished alert cooperative    who appearsr stated  age in no acute distress.  HEENT: normocephalic atraumatic , Eyes: PERRL EOM's full, conjunctiva clear, Nares: paten,t no deformity discharge or tenderness., Ears: no deformity EAC's clear TMs with normal landmarks. Mouth: clear OP, no lesions, edema.  Moist mucous membranes. Dentition in adequate repair. NECK: supple without masses, thyromegaly or bruits. CHEST/PULM:  Clear to auscultation and percussion breath sounds equal no wheeze , rales or rhonchi. No chest wall deformities or tenderness. Breast: normal by inspection . No dimpling, discharge, masses, tenderness or discharge . CV: PMI is nondisplaced, S1 S2 no gallops, murmurs, rubs. Peripheral pulses are full without delay.No JVD .  ABDOMEN: Bowel sounds normal nontender  No guard or rebound, no hepato splenomegal no CVA tenderness.  No hernia. Extremtities:  No clubbing cyanosis or edema, no acute joint swelling or redness no focal atrophy flat footed  NEURO:  Oriented x3, cranial nerves 3-12 appear to be intact, no obvious focal weakness,gait within normal limits no abnormal reflexes or asymmetrical SKIN: No acute rashes normal turgor, color, no bruising or petechiae. PSYCH: Oriented, good eye contact, no obvious depression anxiety, cognition and judgment appear normal. LN: no cervical axillary inguinal adenopathy  Lab Results  Component Value Date   WBC 6.9 06/16/2013   HGB 12.5 06/16/2013   HCT 36.9 06/16/2013   PLT 194.0 06/16/2013   GLUCOSE 95 06/16/2013   CHOL 135 06/16/2013   TRIG 144.0 06/16/2013   HDL 41.80 06/16/2013   LDLCALC 64 06/16/2013   ALT 14 06/16/2013   AST 21 06/16/2013   NA 136 06/16/2013   K 4.0 06/16/2013   CL 102 06/16/2013   CREATININE 0.6 06/16/2013   BUN 12 06/16/2013   CO2 25 06/16/2013   TSH 1.32 06/16/2013   HGBA1C 5.1 02/26/2012    BP Readings from Last 3 Encounters:  05/13/17 94/64  02/12/14 (!) 100/50 (7 %, Z = -1.45 /  4 %, Z = -1.71)*  09/08/13 100/64 (8 %, Z = -1.40 /  39 %, Z =  -0.27)*   *BP percentiles are based on the August 2017 AAP Clinical Practice Guideline for girls    Lab results reviewed with patient   ASSESSMENT AND PLAN:  Discussed the following assessment and plan:  Visit for preventive health examination - Plan: Basic metabolic panel, CBC with Differential/Platelet, Hepatic function panel, Lipid panel, TSH, T4, free  Medication management - Plan: Basic metabolic panel, CBC with Differential/Platelet,  Hepatic function panel, Lipid panel, TSH, T4, free  Need for influenza vaccination - Plan: Flu Vaccine QUAD 6+ mos PF IM (Fluarix Quad PF)  Endocrine function study abnormality - Plan: Basic metabolic panel, CBC with Differential/Platelet, Hepatic function panel, Lipid panel, TSH, T4, free  Goiter - Plan: Basic metabolic panel, CBC with Differential/Platelet, Hepatic function panel, Lipid panel, TSH, T4, free Declined hiv testing done in  Past  UTD .  Patient Care Team: Anaissa Macfadden, Standley Brooking, MD as PCP - General (Internal Medicine) Lelon Huh, MD (Pediatric Endocrinology) Patient Instructions  Continue lifestyle intervention healthy eating and exercise .   Will notify you  of labs when available.  Not sure when you are due for  Tetanus booster but can get at any time  If due    Preventive Care for [place], Female The transition to life after high school as a young adult can be a stressful time with many changes. You may start seeing a primary care physician instead of a pediatrician. This is the time when your health care becomes your responsibility. Preventive care refers to lifestyle choices and visits with your health care provider that can promote health and wellness. What does preventive care include?  A yearly physical exam. This is also called an annual wellness visit.  Dental exams once or twice a year.  Routine eye exams. Ask your health care provider how often you should have your eyes checked.  Personal lifestyle choices,  including: ? Daily care of your teeth and gums. ? Regular physical activity. ? Eating a healthy diet. ? Avoiding tobacco and drug use. ? Avoiding or limiting alcohol use. ? Practicing safe sex. ? Taking vitamin and mineral supplements as recommended by your health care provider. What happens during an annual wellness visit? Preventive care starts with a yearly visit to your primary care physician. The services and screenings done by your health care provider during your annual wellness visit will depend on your overall health, lifestyle risk factors, and family history of disease. Counseling Your health care provider may ask you questions about:  Past medical problems and your family's medical history.  Medicines or supplements you take.  Health insurance and access to health care.  Alcohol, tobacco, and drug use.  Your safety at home, work, or school.  Access to firearms.  Emotional well-being and how you cope with stress.  Relationship well-being.  Diet, exercise, and sleep habits.  Your sexual health and activity.  Your methods of birth control.  Your menstrual cycle.  Your pregnancy history.  Screening You may have the following tests or measurements:  Height, weight, and BMI.  Blood pressure.  Lipid and cholesterol levels.  Tuberculosis skin test.  Skin exam.  Vision and hearing tests.  Screening test for hepatitis.  Screening tests for sexually transmitted diseases (STDs), if you are at risk.  BRCA-related cancer screening. This may be done if you have a family history of breast, ovarian, tubal, or peritoneal cancers.  Pelvic exam and Pap test. This may be done every 3 years starting at age 54.  Vaccines Your health care provider may recommend certain vaccines, such as:  Influenza vaccine. This is recommended every year.  Tetanus, diphtheria, and acellular pertussis (Tdap, Td) vaccine. You may need a Td booster every 10 years.  Varicella  vaccine. You may need this if you have not been vaccinated.  HPV vaccine. If you are 81 or younger, you may need three doses over 6 months.  Measles, mumps, and  rubella (MMR) vaccine. You may need at least one dose of MMR. You may also need a second dose.  Pneumococcal 13-valent conjugate (PCV13) vaccine. You may need this if you have certain conditions and were not previously vaccinated.  Pneumococcal polysaccharide (PPSV23) vaccine. You may need one or two doses if you smoke cigarettes or if you have certain conditions.  Meningococcal vaccine. One dose is recommended if you are age 6-21 years and a first-year college student living in a residence hall, or if you have one of several medical conditions. You may also need additional booster doses.  Hepatitis A vaccine. You may need this if you have certain conditions or if you travel or work in places where you may be exposed to hepatitis A.  Hepatitis B vaccine. You may need this if you have certain conditions or if you travel or work in places where you may be exposed to hepatitis B.  Haemophilus influenzae type b (Hib) vaccine. You may need this if you have certain risk factors.  Talk to your health care provider about which screenings and vaccines you need and how often you need them. What steps can I take to develop healthy behaviors?  Have regular preventive health care visits with your primary care physician and dentist.  Eat a healthy diet.  Drink enough fluid to keep your urine clear or pale yellow.  Stay active. Exercise at least 30 minutes 5 or more days of the week.  Use alcohol responsibly.  Maintain a healthy weight.  Do not use any products that contain nicotine, such as cigarettes, chewing tobacco, and e-cigarettes. If you need help quitting, ask your health care provider.  Do not use drugs.  Practice safe sex.  Use birth control (contraception) to prevent unwanted pregnancy. If you plan to become pregnant, see  your health care provider for a pre-conception visit.  Find healthy ways to manage stress. How can I protect myself from injury? Injuries from violence or accidents are the leading cause of death among young adults and can often be prevented. Take these steps to help protect yourself:  Always wear your seat belt while driving or riding in a vehicle.  Do not drive if you have been drinking alcohol. Do not ride with someone who has been drinking.  Do not drive when you are tired or distracted. Do not text while driving.  Wear a helmet and other protective equipment during sports activities.  If you have firearms in your house, make sure you follow all gun safety procedures.  Seek help if you have been bullied, physically abused, or sexually abused.  Use the Internet responsibly to avoid dangers such as online bullying and online sexual predators.  What can I do to cope with stress? Young adults may face many new challenges that can be stressful, such as finding a job, going to college, moving away from home, managing money, being in a relationship, getting married, and having children. To manage stress:  Avoid known stressful situations when you can.  Exercise regularly.  Find a stress-reducing activity that works best for you. Examples include meditation, yoga, listening to music, or reading.  Spend time in nature.  Keep a journal to write about your stress and how you respond.  Talk to your health care provider about stress. He or she may suggest counseling.  Spend time with supportive friends or family.  Do not cope with stress by: ? Drinking alcohol or using drugs. ? Smoking cigarettes. ? Eating.  Where can  I get more information? Learn more about preventive care and healthy habits from:  [place] and Gynecologists: KaraokeExchange.nl  U.S. Probation officer Task Force:  StageSync.si  National Adolescent and [place]: StrategicRoad.nl  American Academy of Pediatrics Bright Futures: https://brightfutures.MemberVerification.co.za  Society for Adolescent Health and Medicine: MoralBlog.co.za.aspx  PodExchange.nl: ToyLending.fr  This information is not intended to replace advice given to you by your health care provider. Make sure you discuss any questions you have with your health care provider. Document Released: 11/10/2015 Document Revised: 12/01/2015 Document Reviewed: 11/10/2015 Elsevier Interactive Patient Education  2017 [place]. Sharetta Ricchio M.D.

## 2017-05-13 ENCOUNTER — Encounter: Payer: Self-pay | Admitting: Internal Medicine

## 2017-05-13 ENCOUNTER — Ambulatory Visit (INDEPENDENT_AMBULATORY_CARE_PROVIDER_SITE_OTHER): Payer: BLUE CROSS/BLUE SHIELD | Admitting: Internal Medicine

## 2017-05-13 VITALS — BP 94/64 | HR 71 | Temp 98.0°F | Ht 64.25 in | Wt 140.0 lb

## 2017-05-13 DIAGNOSIS — Z0000 Encounter for general adult medical examination without abnormal findings: Secondary | ICD-10-CM | POA: Diagnosis not present

## 2017-05-13 DIAGNOSIS — E049 Nontoxic goiter, unspecified: Secondary | ICD-10-CM

## 2017-05-13 DIAGNOSIS — Z23 Encounter for immunization: Secondary | ICD-10-CM

## 2017-05-13 DIAGNOSIS — Z79899 Other long term (current) drug therapy: Secondary | ICD-10-CM

## 2017-05-13 DIAGNOSIS — R947 Abnormal results of other endocrine function studies: Secondary | ICD-10-CM | POA: Diagnosis not present

## 2017-05-13 LAB — BASIC METABOLIC PANEL
BUN: 8 mg/dL (ref 6–23)
CHLORIDE: 101 meq/L (ref 96–112)
CO2: 28 meq/L (ref 19–32)
Calcium: 9.5 mg/dL (ref 8.4–10.5)
Creatinine, Ser: 0.6 mg/dL (ref 0.40–1.20)
GFR: 132.68 mL/min (ref >60.00)
GLUCOSE: 83 mg/dL (ref 70–99)
Potassium: 4.4 mEq/L (ref 3.5–5.1)
Sodium: 137 mEq/L (ref 135–145)

## 2017-05-13 LAB — HEPATIC FUNCTION PANEL
ALBUMIN: 4.1 g/dL (ref 3.5–5.2)
ALK PHOS: 64 U/L (ref 39–117)
ALT: 17 U/L (ref 0–35)
AST: 15 U/L (ref 0–37)
BILIRUBIN DIRECT: 0.1 mg/dL (ref 0.0–0.3)
BILIRUBIN TOTAL: 0.4 mg/dL (ref 0.2–1.2)
Total Protein: 6.7 g/dL (ref 6.0–8.3)

## 2017-05-13 LAB — CBC WITH DIFFERENTIAL/PLATELET
BASOS ABS: 0.1 10*3/uL (ref 0.0–0.1)
Basophils Relative: 0.9 % (ref 0.0–3.0)
EOS ABS: 0.2 10*3/uL (ref 0.0–0.7)
Eosinophils Relative: 2.3 % (ref 0.0–5.0)
HCT: 38.1 % (ref 36.0–46.0)
Hemoglobin: 12.5 g/dL (ref 12.0–15.0)
LYMPHS ABS: 2.2 10*3/uL (ref 0.7–4.0)
Lymphocytes Relative: 26.9 % (ref 12.0–46.0)
MCHC: 32.9 g/dL (ref 30.0–36.0)
MCV: 88 fl (ref 78.0–100.0)
MONO ABS: 0.4 10*3/uL (ref 0.1–1.0)
Monocytes Relative: 5.4 % (ref 3.0–12.0)
NEUTROS ABS: 5.3 10*3/uL (ref 1.4–7.7)
NEUTROS PCT: 64.5 % (ref 43.0–77.0)
PLATELETS: 257 10*3/uL (ref 150.0–400.0)
RBC: 4.33 Mil/uL (ref 3.87–5.11)
RDW: 13.5 % (ref 11.5–15.5)
WBC: 8.3 10*3/uL (ref 4.0–10.5)

## 2017-05-13 LAB — LIPID PANEL
CHOL/HDL RATIO: 3
Cholesterol: 151 mg/dL (ref 0–200)
HDL: 46.8 mg/dL (ref >39.00)
LDL CALC: 77 mg/dL (ref 0–99)
NonHDL: 104.25
Triglycerides: 134 mg/dL (ref 0.0–149.0)
VLDL: 26.8 mg/dL (ref 0.0–40.0)

## 2017-05-13 LAB — T4, FREE: Free T4: 0.86 ng/dL (ref 0.60–1.60)

## 2017-05-13 LAB — TSH: TSH: 1.42 u[IU]/mL (ref 0.35–4.50)

## 2017-05-13 NOTE — Patient Instructions (Signed)
Continue lifestyle intervention healthy eating and exercise .   Will notify you  of labs when available.  Not sure when you are due for  Tetanus booster but can get at any time  If due    Preventive Care for [place], Female The transition to life after high school as a young adult can be a stressful time with many changes. You may start seeing a primary care physician instead of a pediatrician. This is the time when your health care becomes your responsibility. Preventive care refers to lifestyle choices and visits with your health care provider that can promote health and wellness. What does preventive care include?  A yearly physical exam. This is also called an annual wellness visit.  Dental exams once or twice a year.  Routine eye exams. Ask your health care provider how often you should have your eyes checked.  Personal lifestyle choices, including: ? Daily care of your teeth and gums. ? Regular physical activity. ? Eating a healthy diet. ? Avoiding tobacco and drug use. ? Avoiding or limiting alcohol use. ? Practicing safe sex. ? Taking vitamin and mineral supplements as recommended by your health care provider. What happens during an annual wellness visit? Preventive care starts with a yearly visit to your primary care physician. The services and screenings done by your health care provider during your annual wellness visit will depend on your overall health, lifestyle risk factors, and family history of disease. Counseling Your health care provider may ask you questions about:  Past medical problems and your family's medical history.  Medicines or supplements you take.  Health insurance and access to health care.  Alcohol, tobacco, and drug use.  Your safety at home, work, or school.  Access to firearms.  Emotional well-being and how you cope with stress.  Relationship well-being.  Diet, exercise, and sleep habits.  Your sexual health and  activity.  Your methods of birth control.  Your menstrual cycle.  Your pregnancy history.  Screening You may have the following tests or measurements:  Height, weight, and BMI.  Blood pressure.  Lipid and cholesterol levels.  Tuberculosis skin test.  Skin exam.  Vision and hearing tests.  Screening test for hepatitis.  Screening tests for sexually transmitted diseases (STDs), if you are at risk.  BRCA-related cancer screening. This may be done if you have a family history of breast, ovarian, tubal, or peritoneal cancers.  Pelvic exam and Pap test. This may be done every 3 years starting at age 73.  Vaccines Your health care provider may recommend certain vaccines, such as:  Influenza vaccine. This is recommended every year.  Tetanus, diphtheria, and acellular pertussis (Tdap, Td) vaccine. You may need a Td booster every 10 years.  Varicella vaccine. You may need this if you have not been vaccinated.  HPV vaccine. If you are 75 or younger, you may need three doses over 6 months.  Measles, mumps, and rubella (MMR) vaccine. You may need at least one dose of MMR. You may also need a second dose.  Pneumococcal 13-valent conjugate (PCV13) vaccine. You may need this if you have certain conditions and were not previously vaccinated.  Pneumococcal polysaccharide (PPSV23) vaccine. You may need one or two doses if you smoke cigarettes or if you have certain conditions.  Meningococcal vaccine. One dose is recommended if you are age 27-21 years and a first-year college student living in a residence hall, or if you have one of several medical conditions. You may also need  additional booster doses.  Hepatitis A vaccine. You may need this if you have certain conditions or if you travel or work in places where you may be exposed to hepatitis A.  Hepatitis B vaccine. You may need this if you have certain conditions or if you travel or work in places where you may be exposed to  hepatitis B.  Haemophilus influenzae type b (Hib) vaccine. You may need this if you have certain risk factors.  Talk to your health care provider about which screenings and vaccines you need and how often you need them. What steps can I take to develop healthy behaviors?  Have regular preventive health care visits with your primary care physician and dentist.  Eat a healthy diet.  Drink enough fluid to keep your urine clear or pale yellow.  Stay active. Exercise at least 30 minutes 5 or more days of the week.  Use alcohol responsibly.  Maintain a healthy weight.  Do not use any products that contain nicotine, such as cigarettes, chewing tobacco, and e-cigarettes. If you need help quitting, ask your health care provider.  Do not use drugs.  Practice safe sex.  Use birth control (contraception) to prevent unwanted pregnancy. If you plan to become pregnant, see your health care provider for a pre-conception visit.  Find healthy ways to manage stress. How can I protect myself from injury? Injuries from violence or accidents are the leading cause of death among young adults and can often be prevented. Take these steps to help protect yourself:  Always wear your seat belt while driving or riding in a vehicle.  Do not drive if you have been drinking alcohol. Do not ride with someone who has been drinking.  Do not drive when you are tired or distracted. Do not text while driving.  Wear a helmet and other protective equipment during sports activities.  If you have firearms in your house, make sure you follow all gun safety procedures.  Seek help if you have been bullied, physically abused, or sexually abused.  Use the Internet responsibly to avoid dangers such as online bullying and online sexual predators.  What can I do to cope with stress? Young adults may face many new challenges that can be stressful, such as finding a job, going to college, moving away from home, managing  money, being in a relationship, getting married, and having children. To manage stress:  Avoid known stressful situations when you can.  Exercise regularly.  Find a stress-reducing activity that works best for you. Examples include meditation, yoga, listening to music, or reading.  Spend time in nature.  Keep a journal to write about your stress and how you respond.  Talk to your health care provider about stress. He or she may suggest counseling.  Spend time with supportive friends or family.  Do not cope with stress by: ? Drinking alcohol or using drugs. ? Smoking cigarettes. ? Eating.  Where can I get more information? Learn more about preventive care and healthy habits from:  [place] and Gynecologists: KaraokeExchange.nl  U.S. Probation officer Task Force: StageSync.si  National Adolescent and [place]: StrategicRoad.nl  American Academy of Pediatrics Bright Futures: https://brightfutures.MemberVerification.co.za  Society for Adolescent Health and Medicine: MoralBlog.co.za.aspx  PodExchange.nl: ToyLending.fr  This information is not intended to replace advice given to you by your health care provider. Make sure you discuss any questions you have with your health care provider. Document Released: 11/10/2015 Document Revised: 12/01/2015 Document Reviewed: 11/10/2015  Elsevier Interactive Patient Education  2017 Elsevier Inc.  

## 2017-05-21 ENCOUNTER — Telehealth: Payer: Self-pay | Admitting: Internal Medicine

## 2017-05-21 NOTE — Telephone Encounter (Signed)
Copied from CRM [phone]. Topic: Quick Communication - Lab Results >> May 21, 2017 10:40 AM Percival SpanishKennedy, Cheryl W wrote:  Pt would like a call back about lab results

## 2017-05-21 NOTE — Telephone Encounter (Signed)
Pt called back and lab work results given per previous notes of Dr. Fabian SharpPanosh. Pt verbalized understanding.

## 2017-05-21 NOTE — Telephone Encounter (Signed)
See documentation in result notes. 

## 2017-12-16 DIAGNOSIS — J3081 Allergic rhinitis due to animal (cat) (dog) hair and dander: Secondary | ICD-10-CM | POA: Diagnosis not present

## 2017-12-16 DIAGNOSIS — J3089 Other allergic rhinitis: Secondary | ICD-10-CM | POA: Diagnosis not present

## 2017-12-16 DIAGNOSIS — J301 Allergic rhinitis due to pollen: Secondary | ICD-10-CM | POA: Diagnosis not present

## 2017-12-16 DIAGNOSIS — H1045 Other chronic allergic conjunctivitis: Secondary | ICD-10-CM | POA: Diagnosis not present

## 2018-01-27 ENCOUNTER — Encounter: Payer: Self-pay | Admitting: Internal Medicine

## 2018-01-27 ENCOUNTER — Ambulatory Visit (INDEPENDENT_AMBULATORY_CARE_PROVIDER_SITE_OTHER): Payer: BLUE CROSS/BLUE SHIELD | Admitting: Internal Medicine

## 2018-01-27 ENCOUNTER — Ambulatory Visit (INDEPENDENT_AMBULATORY_CARE_PROVIDER_SITE_OTHER): Payer: BLUE CROSS/BLUE SHIELD

## 2018-01-27 VITALS — BP 102/68 | HR 84 | Temp 98.3°F | Wt 145.5 lb

## 2018-01-27 DIAGNOSIS — M25571 Pain in right ankle and joints of right foot: Secondary | ICD-10-CM

## 2018-01-27 DIAGNOSIS — M79641 Pain in right hand: Secondary | ICD-10-CM | POA: Diagnosis not present

## 2018-01-27 DIAGNOSIS — E049 Nontoxic goiter, unspecified: Secondary | ICD-10-CM | POA: Diagnosis not present

## 2018-01-27 DIAGNOSIS — Q678 Other congenital deformities of chest: Secondary | ICD-10-CM

## 2018-01-27 DIAGNOSIS — M2550 Pain in unspecified joint: Secondary | ICD-10-CM | POA: Diagnosis not present

## 2018-01-27 DIAGNOSIS — G8929 Other chronic pain: Secondary | ICD-10-CM

## 2018-01-27 DIAGNOSIS — L80 Vitiligo: Secondary | ICD-10-CM

## 2018-01-27 DIAGNOSIS — R079 Chest pain, unspecified: Secondary | ICD-10-CM | POA: Diagnosis not present

## 2018-01-27 DIAGNOSIS — M25572 Pain in left ankle and joints of left foot: Secondary | ICD-10-CM | POA: Diagnosis not present

## 2018-01-27 NOTE — Patient Instructions (Addendum)
chest x ray today  To look for  bony  Chest   And then referral to sports   medicine and rheumatology for reasons discussed   poct us  Ankle and hands  .

## 2018-01-27 NOTE — Progress Notes (Signed)
Chief Complaint  Patient presents with  . Mass    Pt has a lump under left breast/upper rib cage.   . Joint Pain    Several years of joint pains. Test run about 4-5 years ago which were normal. Joint swelling as well with skin redness in the past.     HPI: Carol Conrad 22 y.o. come in for sda  Problem  With mom  Who noted  Increasing   assymmetrical on lfet sid e  unde rib cage breast area. .  Then    Mom noted  In bathing suit   But paitent una ware .    Also concerns about ongoing ankle swelling difficulty and sometimes limping.  She has a brace and inserts on her left foot ankle she has had surgery remote past.  Now her right hand is been bothering her no specific excessive use however she is right-handed.  Mom is concerned to have not miss an autoimmune problem for her joint symptoms.  She does occasionally limp there is no fever new rashes.  2016  No autoimmune   Disease  per rheum [facility]  See above no history of chest trauma pectus excavatum when she was younger scoliosis. ROS: See pertinent positives and negatives per HPI.  No respiratory cardiovascular symptoms.  Past Medical History:  Diagnosis Date  . Goiter    borderlne abn tests  following  . Vitiligo    derm evaluation using herval remedy [place] oral and topical    Family History  Problem Relation Age of Onset  . Eczema Father   . Hypertension Father   . Hyperlipidemia Father        Elevated triglycerides  . Eczema Sister   . Hypertension Maternal Grandmother     Social History   Socioeconomic History  . Marital status: Single    Spouse name: Not on file  . Number of children: Not on file  . Years of education: Not on file  . Highest education level: Not on file  Occupational History  . Not on file  Social Needs  . Financial resource strain: Not on file  . Food insecurity:    Worry: Not on file    Inability: Not on file  . Transportation needs:    Medical: Not on file    Non-medical:  Not on file  Tobacco Use  . Smoking status: Never Smoker  . Smokeless tobacco: Never Used  Substance and Sexual Activity  . Alcohol use: No  . Drug use: Not on file  . Sexual activity: Never  Lifestyle  . Physical activity:    Days per week: Not on file    Minutes per session: Not on file  . Stress: Not on file  Relationships  . Social connections:    Talks on phone: Not on file    Gets together: Not on file    Attends religious service: Not on file    Active member of club or organization: Not on file    Attends meetings of clubs or organizations: Not on file    Relationship status: Not on file  Other Topics Concern  . 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 [place]oved from [place]   Parents Anu and Angela BurkeSrikanth   Masters level edu father Art gallery managerengineer  Outpatient Medications Prior to Visit  Medication Sig Dispense Refill  . Clindamycin-Benzoyl Per, Refr, (DUAC) gel Apply 1 application topically 2 (two) times daily. For acne (Patient not taking: Reported on 01/27/2018) 45 g 3   No facility-administered medications prior to visit.      EXAM:  BP 102/68 (BP Location: Right Arm, Patient Position: Sitting, Cuff Size: Normal)   Pulse 84   Temp 98.3 F (36.8 C) (Oral)   Wt 145 lb 8 oz (66 kg)   LMP 01/26/2018   BMI 24.78 kg/m   Body mass index is 24.78 kg/m.  GENERAL: vitals reviewed and listed above, alert, oriented, appears well hydrated and in no acute distress HEENT: atraumatic, conjunctiva  clear, no obvious abnormalities on inspection of external nose and ears NECK: no obvious masses on inspection palpation  LUNGS: clear to auscultation bilaterally, no wheezes, rales or rhonchi, good air movement chest wall no obvious pectus inferior anterior chest wall appears to be anteriorly prominent left more than right but no  mass or lipoma is obvious on exam.  Abdomen soft without organomegaly guarding or rebound.  Back shows no scoliosis. Breast: normal by inspection . No dimpling, discharge, masses, tenderness or discharge . CV: HRRR, no clubbing cyanosis or  peripheral edema nl cap refill  MS: moves all extremities without noticeable focal  abnormality she does have pronated feet slight swelling in the left lateral malleolus hands no deformity some hyperextension.  No obvious atrophy. PSYCH: pleasant and cooperative, no obvious depression or anxiety Lab Results  Component Value Date   WBC 8.3 05/13/2017   HGB 12.5 05/13/2017   HCT 38.1 05/13/2017   PLT 257.0 05/13/2017   GLUCOSE 83 05/13/2017   CHOL 151 05/13/2017   TRIG 134.0 05/13/2017   HDL 46.80 05/13/2017   LDLCALC 77 05/13/2017   ALT 17 05/13/2017   AST 15 05/13/2017   NA 137 05/13/2017   K 4.4 05/13/2017   CL 101 05/13/2017   CREATININE 0.60 05/13/2017   BUN 8 05/13/2017   CO2 28 05/13/2017   TSH 1.42 05/13/2017   HGBA1C 5.1 02/26/2012   BP Readings from Last 3 Encounters:  01/27/18 102/68  05/13/17 94/64  02/12/14 (!) 100/50    ASSESSMENT AND PLAN:  Discussed the following assessment and plan:  Asymmetrical thorax - Do not see any masses on palpation chest x-ray today at this point do not feel further imaging of the chest area is indicated but follow-exam for any changes. - Plan: DG Chest 2 View  Arthralgia, unspecified joint - Plan: Ambulatory referral to Sports Medicine, Ambulatory referral to Rheumatology  Chronic pain of both ankles - Plan: Ambulatory referral to Sports Medicine, Ambulatory referral to Rheumatology  Right hand pain - Plan: Ambulatory referral to Sports Medicine, Ambulatory referral to Rheumatology  Vitiligo  Goiter - nl tsh Hx surgery accessory ? Bone... left ankle   Right hand pain without over use?    Doubt autoimmune but has   Some conditions and worsening issues sometime limping and dysfunction   I  suspect the  Sx appearst to be  A chest wall minor asymmetry  And  Not alarming  Dont feel a lipoma or other  But  Has habitus like a curved  anterior chest  No pectus hx  Discussed options we will get opinion from sports medicine about the ankles and perhaps the hand as they have point-of-care ultrasound We will also do referral to rheumatology for updated evaluation concerned about autoimmune disease no laboratory  studies done today previous evaluation should be in epic. -Patient advised to return or notify health care team  if  new concerns arise.  Patient Instructions  chest x ray today  To look for  bony  Chest   And then referral to sports   medicine and rheumatology for reasons discussed   poct [place]  Ankle and hands  .       Neta Mends. Wendelin Bradt M.D.

## 2018-02-04 ENCOUNTER — Ambulatory Visit: Payer: Self-pay

## 2018-02-04 ENCOUNTER — Ambulatory Visit: Payer: BLUE CROSS/BLUE SHIELD | Admitting: Sports Medicine

## 2018-02-04 ENCOUNTER — Ambulatory Visit (INDEPENDENT_AMBULATORY_CARE_PROVIDER_SITE_OTHER): Payer: BLUE CROSS/BLUE SHIELD | Admitting: Sports Medicine

## 2018-02-04 ENCOUNTER — Encounter: Payer: Self-pay | Admitting: Sports Medicine

## 2018-02-04 VITALS — BP 100/62 | HR 81 | Ht 65.0 in | Wt 142.2 lb

## 2018-02-04 DIAGNOSIS — Q678 Other congenital deformities of chest: Secondary | ICD-10-CM | POA: Diagnosis not present

## 2018-02-04 DIAGNOSIS — M25572 Pain in left ankle and joints of left foot: Secondary | ICD-10-CM

## 2018-02-04 DIAGNOSIS — M9902 Segmental and somatic dysfunction of thoracic region: Secondary | ICD-10-CM | POA: Diagnosis not present

## 2018-02-04 DIAGNOSIS — R0781 Pleurodynia: Secondary | ICD-10-CM | POA: Diagnosis not present

## 2018-02-04 DIAGNOSIS — G8929 Other chronic pain: Secondary | ICD-10-CM

## 2018-02-04 DIAGNOSIS — M25571 Pain in right ankle and joints of right foot: Secondary | ICD-10-CM | POA: Diagnosis not present

## 2018-02-04 DIAGNOSIS — M9901 Segmental and somatic dysfunction of cervical region: Secondary | ICD-10-CM

## 2018-02-04 DIAGNOSIS — M2550 Pain in unspecified joint: Secondary | ICD-10-CM | POA: Diagnosis not present

## 2018-02-04 DIAGNOSIS — M79641 Pain in right hand: Secondary | ICD-10-CM

## 2018-02-04 DIAGNOSIS — M9908 Segmental and somatic dysfunction of rib cage: Secondary | ICD-10-CM

## 2018-02-04 LAB — VITAMIN D 25 HYDROXY (VIT D DEFICIENCY, FRACTURES): VITD: 20.76 ng/mL — AB (ref 30.00–100.00)

## 2018-02-04 LAB — SEDIMENTATION RATE: SED RATE: 41 mm/h — AB (ref 0–20)

## 2018-02-04 LAB — C-REACTIVE PROTEIN: CRP: 0.1 mg/dL — AB (ref 0.5–20.0)

## 2018-02-04 MED ORDER — DICLOFENAC SODIUM 2 % TD SOLN: 1.0000 "application " | Freq: Two times a day (BID) | TRANSDERMAL | 0 refills | Status: AC

## 2018-02-04 MED ORDER — DICLOFENAC SODIUM 2 % TD SOLN: 1.0000 "application " | Freq: Two times a day (BID) | TRANSDERMAL | 2 refills | Status: DC

## 2018-02-04 NOTE — Progress Notes (Signed)
Carol Conrad. Carol Conrad [facility] [phone]  Jissell Trafton - 22 y.o. female MRN [id number]  Date of birth: [birth year]  Visit Date: 02/04/2018  PCP: Madelin Headings, MD   Referred by: Madelin Headings, MD  Scribe(s) for today's visit: Stevenson Clinch, CMA  SUBJECTIVE:  Carol Conrad is here for No chief complaint on file. Carol Conrad  Referred by: Dr. Berniece Andreas  Her joint pain symptoms INITIALLY: Began several years ago and she has been seen by rheumatology, neurology, and ortho in the past. She has been dx with vidligo.  Described as mild-moderate aching in the morning. nonradiating Worsened with walking, activity. Sx seem to be worse when it is cold or raining.  Improved with rest. Additional associated symptoms include: She c/o pain in the L calf that started 2-3 days ago. She has been doing more walking than normal while at camp. She has noticed constant swelling in both ankles, L>R. Work up was done with rheumatology and they found no autoimmune disorders. She reports that she used to get warm rashes on her legs that seemed to appear for no reason. The rash burned slightly but did not itch. The rash would only last minutes to hours. She denies visual changes. She does report HA but feels this improved after getting new rx for glasses. 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 She reports occasional n/t in R hand and L leg. She c/o weakness and stiffness in her hands. Mom reports that shes had decreased grip strength since she was a young child.     At this time symptoms are worsening compared to onset. She has been trying to rest and elevate her legs as needed. In the past she has tried IBU and Tylenol with minimal relief. She has done PT in the past and notes minimal improvement. She has inserts that she wears in her shoes because she has flat feet.   Hand pain has started over the past year.  No recent XR of  hands/wrists.   She reports ankle pain since she was in elementary.  Last XR of her feet 02/2015  She also c/o lump on L side of ribs. She first noticed the rib 1-2 weeks ago. Mom reports that this has been present for a long time but seems to have grown over time. The area is not painful, red, or warm to touch. She denies drainage from the area.    REVIEW OF SYSTEMS: Denies night time disturbances. Denies fevers, chills, or night sweats. Denies unexplained weight loss. Denies personal history of cancer. Denies changes in bowel or bladder habits. Denies recent unreported falls. Denies new or worsening dyspnea or wheezing. Reports headaches.  Reports numbness, tingling or weakness LLE.  Denies dizziness or presyncopal episodes Reports lower extremity edema    HISTORY:  Prior history reviewed and updated per electronic medical record.  Social History   Occupational History  . Not on file  Tobacco Use  . Smoking status: Never Smoker  . Smokeless tobacco: Never Used  Substance and Sexual Activity  . Alcohol use: No  . Drug use: Not on file  . 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 Moved from [place] 2012  Parents Anu and Srikanth   Masters level edu father Art gallery managerengineer    Past Medical History:  Diagnosis Date  . Goiter    borderlne abn tests  following  . Vitiligo    derm evaluation using herval remedy [place] oral and topical   Past Surgical History:  Procedure Laterality Date  . ADENOIDECTOMY  Age 73  . ANKLE SURGERY  4th Grade   left navicular    family history includes Eczema in her father and sister; Hyperlipidemia in her father; Hypertension in her father and maternal grandmother.  DATA OBTAINED & REVIEWED:  No results for input(s): HGBA1C, LABURIC, CREATINE in the last 8760  hours. . Significant rheumatologic evaluation previously performed that was effectively negative for any type of inflammatory arthritis. Carol Conrad. She was previously seen by Dr. Marchelle GearingVedic with pediatric endocrinology for autoimmune thyroiditis but has not had any subsequent issues since that time.  OBJECTIVE:  VS:  HT:5\' 5"  (165.1 cm)   WT:142 lb 3.2 oz (64.5 kg)  BMI:23.66    BP:100/62  HR:81bpm  TEMP: ( )  RESP:97 %   PHYSICAL EXAM: CONSTITUTIONAL: Well-developed, Well-nourished and In no acute distress PSYCHIATRIC: Alert & appropriately interactive. and Not depressed or anxious appearing. RESPIRATORY: No increased work of breathing and Trachea Midline EYES: Pupils are equal., EOM intact without nystagmus. and No scleral icterus.  VASCULAR EXAM: Warm and well perfused NEURO: unremarkable  MSK Exam: Thorax  She is slight prominence of the left anterior rib cage and a small amount of pain with this.  There is no significant scoliotic curvature but a slight rotational component of the mid thoracic spine. No overlying skin changes. TTP over Mild tenderness over the costochondral junction of the left thorax   RANGE OF MOTION & STRENGTH  Poor thoracic range of motion.   SPECIALITY TESTING:  Bilateral ankles are well aligned although generalized tenderness across the anterior lateral ankles.  Hands are well aligned with no significant swelling or bossing but slight puffiness appreciated today.        ASSESSMENT   1. Rib pain   2. Asymmetrical thorax   3. Arthralgia, unspecified joint   4. Chronic pain of both ankles   5. Right hand pain     PLAN:  Pertinent additional documentation may be included in corresponding procedure notes, imaging studies, problem based documentation and patient instructions.  Procedures:  . Osteopathic manipulation was performed today based on physical exam findings.  Please see procedure note for further information including Osteopathic Exam  findings  Medications:  Meds ordered this encounter  Medications  . Diclofenac Sodium (PENNSAID) 2 % SOLN    Sig: Place 1 application onto the skin 2 (two) times daily.    Dispense:  112 g    Refill:  2    Home Phone      [phone] Mobile          [phone]   . Diclofenac Sodium (PENNSAID) 2 % SOLN    Sig: Place 1 application onto the skin 2 (two) times daily for 1 day.    Dispense:  8 g    Refill:  0  . Cholecalciferol 50000 units TABS    Sig: Take 1 tablet by mouth once a week.    Dispense:  12 tablet    Refill:  0    Do not substitute ergocalciferol   Discussion/Instructions: No problem-specific Assessment & Plan notes found for this encounter.  . 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   .Carol Conrad  Ultimately given the significant rheumatologic work-up completed within the last several years and reviewed today in detail significant reevaluation is not warranted however I will go ahead and check inflammatory markers as well as vitamin D and Lyme panel given possible exposure although discussed the high likelihood of false positives. . Overall reassurance provided for the hands we will give her topical anti-inflammatories to see if this is beneficial. . Discussed red flag symptoms that warrant earlier emergent evaluation and patient voices understanding. . Activity modifications and the importance of avoiding exacerbating activities (limiting pain to no more than a 4 / 10 during or following activity) recommended and discussed.  . Over-the-counter SOL E insoles recommended  Follow-up:  . Return in about 1 month (around 03/04/2018).  . If any lack of improvement consider: . further diagnostic evaluation with Repeat serology or advanced diagnostic imaging.     CMA/ATC served as Neurosurgeon during this visit. History, Physical, and Plan performed by medical provider. Documentation and orders reviewed and attested to.      [name], DO    [facility] Physician

## 2018-02-04 NOTE — Patient Instructions (Addendum)
[facility] instructions for Duexis, Pennsaid and Vimovo:  Your prescription will be filled through a mail order pharmacy.  It is typically [facility] but may vary depending on where you live.  You will receive a phone call from them which will typically come from a [phone] phone number.  You must speak directly to them to have this medication filled.  When the pharmacy calls, they will need your mailing address (for overnight shipment of the medication) andy they will need payment information if you have a copay (typically no more than $10). If you have not heard from them 2-3 days after your appointment with Dr. Berline Choughigby, contact us at the office [phone]) or through MyChart so we can reach back out to the pharmacy.  Pennsaid instructions: You have been given a sample/prescription for Pennsaid, a topical medication.     You are to apply this gel to your injured body part twice daily (morning and evening).   A little goes a long way so you can use about a pea-sized amount for each area.   Spread this small amount over the area into a thin film and let it dry.   Be sure that you do not rub the gel into your skin for more than 10 or 15 seconds otherwise it can irritate you skin.    Once you apply the gel, please do not put any other lotion or clothing in contact with that area for 30 minutes to allow the gel to absorb into your skin.   Some people are sensitive to the medication and can develop a sunburn-like rash.  If you have only mild symptoms it is okay to continue to use the medication but if you have any breakdown of your skin you should discontinue its use and please let us know.   If you have been written a prescription for Pennsaid, you will receive a pump bottle of this topical gel through a mail order pharmacy.  The instructions on the bottle will say to apply two pumps twice a day which may be too much gel for your particular area so use the pea-sized amount as your  guide.   I recommend that you obtain over-the-counter SOLE  medium cushioned insoles.  These can be found at [employer] - or on-line at [employer]  Search for "SOLE Active Medium Shoe Insoles"

## 2018-02-05 LAB — B. BURGDORFI ANTIBODIES: B burgdorferi Ab IgG+IgM: <0.9 index

## 2018-02-10 ENCOUNTER — Ambulatory Visit: Payer: BLUE CROSS/BLUE SHIELD | Admitting: Sports Medicine

## 2018-02-14 ENCOUNTER — Telehealth: Payer: Self-pay

## 2018-02-14 NOTE — Telephone Encounter (Signed)
Copied from CRM [id number]. Topic: Quick Communication - Lab Results >> Feb 14, 2018 12:24 PM Laural BenesJohnson, Louisianahiquita C wrote: Pt called in for her lab results.  CB: [phone]: [phone] - moms phone number, Annu , pt would like for us to go over results with mom.

## 2018-02-17 NOTE — Telephone Encounter (Signed)
Dr Rigby, please advise.  

## 2018-02-18 ENCOUNTER — Telehealth: Payer: Self-pay | Admitting: Sports Medicine

## 2018-02-18 ENCOUNTER — Ambulatory Visit: Payer: BLUE CROSS/BLUE SHIELD | Admitting: Family Medicine

## 2018-02-18 MED ORDER — CHOLECALCIFEROL 1.25 MG (50000 UT) PO TABS
1.0000 | ORAL_TABLET | ORAL | 0 refills | Status: DC
Start: 2018-02-18 — End: 2018-08-01

## 2018-02-18 NOTE — Telephone Encounter (Signed)
Called pt and left VM to call the office. CRM created.  

## 2018-02-18 NOTE — Progress Notes (Signed)
There is a small amount of inflammation that is likely due to the underlying thyroid condition that you have however looking at Lyme disease were normal.  Given the extensive work-up in the past I am reassured that there is nothing further at this time other than what we have previously discussed and to supplement vitamin D with a prescription dose that we will send in.  This will need to be rechecked in 12 weeks.

## 2018-02-18 NOTE — Telephone Encounter (Signed)
Copied from CRM [phone]. Topic: Quick Communication - Rx Refill/Question >> Feb 18, 2018  4:46 PM Zada GirtLander, Lumin L wrote: Medication: Diclofenac Sodium (PENNSAID) 2 % SOLN (call phamracy to discuss PA that wa submitted, additional information needed)  Has the patient contacted their pharmacy? Yes.   (Agent: If no, request that the patient contact the pharmacy for the refill.) (Agent: If yes, when and what did the pharmacy advise?)  Preferred Pharmacy (with phone number or street name): [facility] P:[phone]  Agent: Please be advised that RX refills may take up to 3 business days. We ask that you follow-up with your pharmacy.

## 2018-02-19 NOTE — Telephone Encounter (Signed)
Called and provided dx and meds tried/failed. No further action needed at this time.

## 2018-03-07 ENCOUNTER — Encounter: Payer: Self-pay | Admitting: Sports Medicine

## 2018-03-07 ENCOUNTER — Ambulatory Visit (INDEPENDENT_AMBULATORY_CARE_PROVIDER_SITE_OTHER): Payer: BLUE CROSS/BLUE SHIELD | Admitting: Sports Medicine

## 2018-03-07 VITALS — BP 102/68 | HR 81 | Ht 65.0 in | Wt 143.8 lb

## 2018-03-07 DIAGNOSIS — L80 Vitiligo: Secondary | ICD-10-CM

## 2018-03-07 DIAGNOSIS — M2550 Pain in unspecified joint: Secondary | ICD-10-CM | POA: Diagnosis not present

## 2018-03-07 DIAGNOSIS — M25572 Pain in left ankle and joints of left foot: Secondary | ICD-10-CM

## 2018-03-07 DIAGNOSIS — R947 Abnormal results of other endocrine function studies: Secondary | ICD-10-CM

## 2018-03-07 DIAGNOSIS — Q678 Other congenital deformities of chest: Secondary | ICD-10-CM

## 2018-03-07 DIAGNOSIS — R0781 Pleurodynia: Secondary | ICD-10-CM | POA: Diagnosis not present

## 2018-03-07 DIAGNOSIS — G8929 Other chronic pain: Secondary | ICD-10-CM

## 2018-03-07 DIAGNOSIS — E559 Vitamin D deficiency, unspecified: Secondary | ICD-10-CM

## 2018-03-07 DIAGNOSIS — M25571 Pain in right ankle and joints of right foot: Secondary | ICD-10-CM

## 2018-03-07 NOTE — Progress Notes (Signed)
PROCEDURE NOTE: THERAPEUTIC EXERCISES (97110) 15 minutes spent for Therapeutic exercises as below and as referenced in the AVS.  This included exercises focusing on stretching, strengthening, with significant focus on eccentric aspects.   Proper technique shown and discussed handout in great detail with ATC.  All questions were discussed and answered.   Long term goals include an improvement in range of motion, strength, endurance as well as avoiding reinjury. Frequency of visits is one time as determined during today's  office visit. Frequency of exercises to be performed is as per handout.  EXERCISES REVIEWED:  Thoracic Mobility 

## 2018-03-07 NOTE — Progress Notes (Signed)
Carol Conrad. Carol Conrad [phone]  Carol Conrad - 22 y.o. female MRN [id number]  Date of birth: 05-13-1996  Visit Date: 03/07/2018  PCP: Carol Headings, MD   Referred by: Carol Headings, MD  Scribe(s) for today's visit: Carol Conrad, CMA  SUBJECTIVE:  Carol Conrad is here for Follow-up (rib pain)   Her joint pain symptoms INITIALLY: Began several years ago and she has been seen by rheumatology, neurology, and ortho in the past. She has been dx with vidligo.  Described as mild-moderate aching in the morning. nonradiating Worsened with walking, activity. Sx seem to be worse when it is cold or raining.  Improved with rest. Additional associated symptoms include: She c/o pain in the L calf that started 2-3 days ago. She has been doing more walking than normal while at camp. She has noticed constant swelling in both ankles, L>R. Work up was done with rheumatology and they found no autoimmune disorders. She reports that she used to get warm rashes on her legs that seemed to appear for no reason. The rash burned slightly but did not itch. The rash would only last minutes to hours. She denies visual changes. She does report HA but feels this improved after getting new rx for glasses. 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 She reports occasional n/t in R hand and L leg. She c/o weakness and stiffness in her hands. Mom reports that shes had decreased grip strength since she was a young child.    At this time symptoms are worsening compared to onset. She has been trying to rest and elevate her legs as needed. In the past she has tried IBU and Tylenol with minimal relief. She has done PT in the past and notes minimal improvement. She has inserts that she wears in her shoes because she has flat feet.   Hand pain has started over the past year.  No recent XR of hands/wrists.   She reports ankle pain since she  was in elementary.  Last XR of her feet 02/2015  She also c/o lump on L side of ribs. She first noticed the rib 1-2 weeks ago. Mom reports that this has been present for a long time but seems to have grown over time. The area is not painful, red, or warm to touch. She denies drainage from the area.   03/07/2018: Compared to the last office visit, her previously described symptoms are improving. She reports that ankle pain and hand pain improved after starting Vitamin D.  Current symptoms are mild & are nonradiating She has not been using Pennsaid because hand pain has improved. She has not picked up SOLE inserts yet because she is planning on buying new shoes soon. She has tylenol and IBU to take prn but has not needed it recently.    REVIEW OF SYSTEMS: Denies night time disturbances. Denies fevers, chills, or night sweats. Denies unexplained weight loss. Denies personal history of cancer. Denies changes in bowel or bladder habits. Denies recent unreported falls. Denies new or worsening dyspnea or wheezing. Reports headaches - occurring less often.  Denies numbness, tingling or weakness  In the extremities.  Denies dizziness or presyncopal episodes Reports lower extremity edema - L ankle.     HISTORY & PERTINENT PRIOR DATA:  Significant/pertinent history, findings, studies include:  reports that she has never smoked. She has never used smokeless tobacco. No results for input(s): HGBA1C, LABURIC,  CREATINE in the last 8760 hours. No specialty comments available. Problem  Vitamin D Deficiency   possible  fam hx of same      Otherwise prior history reviewed and updated per electronic medical record.    OBJECTIVE:  VS:  HT:5\' 5"  (165.1 cm)   WT:143 lb 12.8 oz (65.2 kg)  BMI:23.93    BP:102/68  HR:81bpm  TEMP: ( )  RESP:98 %   PHYSICAL EXAM: CONSTITUTIONAL: Well-developed, Well-nourished and In no acute distress Alert & appropriately interactive. and Not depressed or  anxious appearing. RESPIRATORY: No increased work of breathing and Trachea Midline EYES: Pupils are equal., EOM intact without nystagmus. and No scleral icterus.  Upper extremities: Warm and well perfused NEURO: unremarkable  MSK Exam: Back:: . Well aligned, no significant deformity. . No overlying skin changes. . Generalized tenderness over the left periscapular region.  She has a left thoracic rotation but no significant scoliosis. . Limited thoracic rotation to the right.  Slight prominence of the left anterior thoracic cage. .  .    PROCEDURES & DATA REVIEWED:  . Discussed the foundation of treatment for this condition is physical therapy and/or daily (5-6 days/week) therapeutic exercises, focusing on core strengthening, coordination, neuromuscular control/reeducation.  Therapeutic exercises prescribed per procedure note.  ASSESSMENT   1. Rib pain   2. Vitiligo   3. Asymmetrical thorax   4. Arthralgia, unspecified joint   5. Chronic pain of both ankles   6. Endocrine function study abnormality   7. Vitamin D deficiency     PLAN:       . We will plan to recheck vitamin D level and sed rate at follow-up. . Progressing with exercises reviewed today. . Continue Pennsaid as needed which she was having only infrequent flareups. . Low likelihood of underlying rheumatologic condition but if persistently elevated sed  . Please see procedure section and notes. No problem-specific Assessment & Plan notes found for this encounter.   Follow-up: Return in about 8 weeks (around 05/02/2018) for repeat lab work.      Please see additional documentation for Objective, Assessment and Plan sections. Pertinent additional documentation may be included in corresponding procedure notes, imaging studies, problem based documentation and patient instructions. Please see these sections of the encounter for additional information regarding this visit.  CMA/ATC served as Neurosurgeonscribe during this  visit. History, Physical, and Plan performed by medical provider. Documentation and orders reviewed and attested to.      Carol MewsMichael D Rigby, DO    [facility]

## 2018-03-24 ENCOUNTER — Encounter: Payer: Self-pay | Admitting: Sports Medicine

## 2018-03-24 NOTE — Progress Notes (Signed)
PROCEDURE NOTE : OSTEOPATHIC MANIPULATION The decision today to treat with Osteopathic Manipulative Therapy (OMT) was based on physical exam findings. Verbal consent was obtained following a discussion with the patient regarding the of risks, benefits and potential side effects, including an acute pain flare,post manipulation soreness and need for repeat treatments.     Contraindications to OMT: NONE  Manipulation was performed as below: Regions Treated OMT Techniques Used  Thoracic spine HVLA myofascial release   The patient tolerated the treatment well and reported Improved symptoms following treatment today. Patient was given medications, exercises, stretches and lifestyle modifications per AVS and verbally.   OSTEOPATHIC/STRUCTURAL EXAM:   T6 -10 Neutral, Rotated LEFT, Sidebent RIGHT

## 2018-04-29 DIAGNOSIS — Z01419 Encounter for gynecological examination (general) (routine) without abnormal findings: Secondary | ICD-10-CM | POA: Diagnosis not present

## 2018-04-29 DIAGNOSIS — Z113 Encounter for screening for infections with a predominantly sexual mode of transmission: Secondary | ICD-10-CM | POA: Diagnosis not present

## 2018-04-29 DIAGNOSIS — Z1329 Encounter for screening for other suspected endocrine disorder: Secondary | ICD-10-CM | POA: Diagnosis not present

## 2018-04-29 DIAGNOSIS — Z118 Encounter for screening for other infectious and parasitic diseases: Secondary | ICD-10-CM | POA: Diagnosis not present

## 2018-04-29 DIAGNOSIS — Z130 Encounter for screening for diseases of the blood and blood-forming organs and certain disorders involving the immune mechanism: Secondary | ICD-10-CM | POA: Diagnosis not present

## 2018-04-29 DIAGNOSIS — Z0000 Encounter for general adult medical examination without abnormal findings: Secondary | ICD-10-CM | POA: Diagnosis not present

## 2018-04-29 DIAGNOSIS — Z13220 Encounter for screening for lipoid disorders: Secondary | ICD-10-CM | POA: Diagnosis not present

## 2018-04-29 DIAGNOSIS — Z6824 Body mass index (BMI) 24.0-24.9, adult: Secondary | ICD-10-CM | POA: Diagnosis not present

## 2018-04-29 DIAGNOSIS — Z23 Encounter for immunization: Secondary | ICD-10-CM | POA: Diagnosis not present

## 2018-05-01 ENCOUNTER — Encounter: Payer: Self-pay | Admitting: Sports Medicine

## 2018-05-01 ENCOUNTER — Ambulatory Visit (INDEPENDENT_AMBULATORY_CARE_PROVIDER_SITE_OTHER): Payer: BLUE CROSS/BLUE SHIELD | Admitting: Sports Medicine

## 2018-05-01 VITALS — BP 100/76 | HR 83 | Ht 65.0 in | Wt 147.6 lb

## 2018-05-01 DIAGNOSIS — E559 Vitamin D deficiency, unspecified: Secondary | ICD-10-CM

## 2018-05-01 DIAGNOSIS — M25472 Effusion, left ankle: Secondary | ICD-10-CM

## 2018-05-01 DIAGNOSIS — G8929 Other chronic pain: Secondary | ICD-10-CM

## 2018-05-01 DIAGNOSIS — M25572 Pain in left ankle and joints of left foot: Secondary | ICD-10-CM

## 2018-05-01 DIAGNOSIS — M2550 Pain in unspecified joint: Secondary | ICD-10-CM

## 2018-05-01 DIAGNOSIS — Q678 Other congenital deformities of chest: Secondary | ICD-10-CM | POA: Diagnosis not present

## 2018-05-01 DIAGNOSIS — M25571 Pain in right ankle and joints of right foot: Secondary | ICD-10-CM | POA: Diagnosis not present

## 2018-05-01 LAB — VITAMIN D 25 HYDROXY (VIT D DEFICIENCY, FRACTURES): VITD: 49.66 ng/mL (ref 30.00–100.00)

## 2018-05-01 LAB — SEDIMENTATION RATE: Sed Rate: 20 mm/hr (ref 0–20)

## 2018-05-01 NOTE — Assessment & Plan Note (Signed)
Continue with over-the-counter insoles as well as addition of a Body Helix Compression Sleeve generic ankle compression.

## 2018-05-01 NOTE — Progress Notes (Signed)
MyChart Message sent as below. Please call and make sure to schedule  Your bloodwork looks great and your Vitamin D and inflammatory markers are normal.  I would like to see you back in 3 months to recheck them one additional time.  If you have any worsening symptoms in the interim I'm always happy to see you back sooner.

## 2018-05-01 NOTE — Progress Notes (Signed)
Carol Conrad. Carol Conrad Sports [facility] [phone]  Carol Conrad - 22 y.o. female MRN [id number]  Date of birth: December 29, 1995  Visit Date: 05/01/2018  PCP: Madelin Headings, MD   Referred by: Madelin Headings, MD  Scribe(s) for today's visit: Stevenson Clinch, CMA  SUBJECTIVE:  Carol Conrad is here for Follow-up (rib, ankle, hand pain)   HPI:  02/04/2018: Her joint pain symptoms INITIALLY: Began several years ago and she has been seen by rheumatology, neurology, and ortho in the past. She has been dx with vidligo.  Described as mild-moderate aching in the morning. nonradiating Worsened with walking, activity. Sx seem to be worse when it is cold or raining.  Improved with rest. Additional associated symptoms include: She c/o pain in the L calf that started 2-3 days ago. She has been doing more walking than normal while at camp. She has noticed constant swelling in both ankles, L>R. Work up was done with rheumatology and they found no autoimmune disorders. She reports that she used to get warm rashes on her legs that seemed to appear for no reason. The rash burned slightly but did not itch. The rash would only last minutes to hours. She denies visual changes. She does report HA but feels this improved after getting new rx for glasses. 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 She reports occasional n/t in R hand and L leg. She c/o weakness and stiffness in her hands. Mom reports that shes had decreased grip strength since she was a young child.    At this time symptoms are worsening compared to onset. She has been trying to rest and elevate her legs as needed. In the past she has tried IBU and Tylenol with minimal relief. She has done PT in the past and notes minimal improvement. She has inserts that she wears in her shoes because she has flat feet.  Hand pain has started over the past year.  No recent XR of hands/wrists.  She  reports ankle pain since she was in elementary.  Last XR of her feet 02/2015 She also c/o lump on L side of ribs. She first noticed the rib 1-2 weeks ago. Mom reports that this has been present for a long time but seems to have grown over time. The area is not painful, red, or warm to touch. She denies drainage from the area.   03/07/2018: Compared to the last office visit, her previously described symptoms are improving. She reports that ankle pain and hand pain improved after starting Vitamin D.  Current symptoms are mild & are nonradiating She has not been using Pennsaid because hand pain has improved. She has not picked up SOLE inserts yet because she is planning on buying new shoes soon. She has tylenol and IBU to take prn but has not needed it recently.   05/01/2018: L ankle pain: Compared to the last office visit, her previously described symptoms are worsening, pain is more frequent and more intense. Sx worsen with exercise. She reports that she has gain weight recently because she had cut back on exercising because of her ankle pain. She feels that the weight gain is also contributing to her increased pain. Some days she will have pain even when at rest. She reports occasional swelling in the L foot and ankle.  Current symptoms are mild & are radiating to into her leg. When sx are flaring up, pain is more moderate.  She has been taking Tylenol or IBU prn with some relief. She has been taking 5,000 IU of Vitamin D weekly. She will take her last Vit D tonight.   She reports no change with "lump" on L side ribs.   She reports no pain in her hands recently.   REVIEW OF SYSTEMS: Denies night time disturbances. Denies fevers, chills, or night sweats. Denies unexplained weight loss. Denies personal history of cancer. Denies changes in bowel or bladder habits. Denies recent unreported falls. Denies new or worsening dyspnea or wheezing. Reports headaches - occurring less often.  Denies  numbness, tingling or weakness in the extremities.  Denies dizziness or presyncopal episodes Reports lower extremity edema - L ankle.     HISTORY & PERTINENT PRIOR DATA:  Significant/pertinent history, findings, studies include:  reports that she has never smoked. She has never used smokeless tobacco. No results for input(s): HGBA1C, LABURIC, CREATINE in the last 8760 hours. No specialty comments available. No problems updated.  Otherwise prior history reviewed and updated per electronic medical record.    OBJECTIVE:  VS:  HT:    WT:   BMI:     BP:   HR: bpm  TEMP: ( )  RESP:    PHYSICAL EXAM: CONSTITUTIONAL: Well-developed, Well-nourished and In no acute distress Psychiatric: Alert & appropriately interactive. and Not depressed or anxious appearing. RESPIRATORY: No increased work of breathing and Trachea Midline EYES: Pupils are equal., EOM intact without nystagmus. and No scleral icterus.  Upper extremities: EXTREMITY EXAM: Warm and well perfused NEURO: unremarkable  MSK Exam: Back:: . Well aligned, no significant deformity. . No overlying skin changes. . Generalized tenderness over the left periscapular region.  She has a left thoracic rotation but no significant scoliosis. . Limited thoracic rotation to the right.  Slight prominence of the left anterior thoracic cage.   Ankle: Overall well aligned without significant deformity.  She has full ankle range of motion.  Ligamentously stable.  Intrinsic ankle strength is 5/5.  ASSESSMENT   1. Asymmetrical thorax   2. Vitamin D deficiency   3. Arthralgia, unspecified joint   4. Chronic pain of both ankles   5. Left ankle swelling     PLAN:  Pertinent additional documentation may be included in corresponding procedure notes, imaging studies, problem based documentation and patient instructions.  Procedures:  . None  Medications:  No orders of the defined types were placed in this  encounter.  Discussion/Instructions: Vitamin D deficiency We will plan to recheck labs today.  Left ankle swelling Continue with over-the-counter insoles as well as addition of a Body Helix Compression Sleeve generic ankle compression.  . Recheck blood work today.  If persistently elevated sed rate will likely need referral to adult rheumatology. .   . Continue previously prescribed home exercise program.  . Further Testing ordered: Sed rate and vitamin D . Discussed red flag symptoms that warrant earlier emergent evaluation and patient voices understanding. . Activity modifications and the importance of avoiding exacerbating activities (limiting pain to no more than a 4 / 10 during or following activity) recommended and discussed.  Follow-up:  . Return if symptoms worsen or fail to improve.  . If elevated sed rate were persistently low vitamin D will need to recheck in 3 months  . If any lack of improvement consider: Referral to rheumatology     CMA/ATC served as scribe during this visit. History, Physical, and Plan performed by medical provider. Documentation and orders reviewed and attested to.  Gerda Diss, [facility] Physician

## 2018-05-01 NOTE — Assessment & Plan Note (Signed)
We will plan to recheck labs today.

## 2018-05-01 NOTE — Patient Instructions (Signed)
I recommend you obtained a compression sleeve to help with your joint problems. There are many options on the market however I recommend obtaining a Medium Full Ankle Body Helix compression sleeve.  You can find information (including how to appropriate measure yourself for sizing) can be found at [email or web address].ch.  Many of these products are health savings account (HSA) eligible.   You can use the compression sleeve at any time throughout the day but is most important to use while being active as well as for 2 hours post-activity.   It is appropriate to ice following activity with the compression sleeve in place.

## 2018-05-07 ENCOUNTER — Other Ambulatory Visit: Payer: Self-pay | Admitting: Sports Medicine

## 2018-05-07 NOTE — Telephone Encounter (Signed)
Per Dr. Berline Chough, OK for pt to take 2,000 IU daily instead of Rx Vit D.

## 2018-05-14 ENCOUNTER — Other Ambulatory Visit: Payer: BLUE CROSS/BLUE SHIELD

## 2018-05-15 ENCOUNTER — Other Ambulatory Visit: Payer: BLUE CROSS/BLUE SHIELD

## 2018-07-16 ENCOUNTER — Ambulatory Visit (INDEPENDENT_AMBULATORY_CARE_PROVIDER_SITE_OTHER): Payer: BLUE CROSS/BLUE SHIELD | Admitting: Internal Medicine

## 2018-07-16 ENCOUNTER — Encounter: Payer: Self-pay | Admitting: Internal Medicine

## 2018-07-16 ENCOUNTER — Ambulatory Visit: Payer: Self-pay | Admitting: *Deleted

## 2018-07-16 VITALS — BP 118/60 | HR 120 | Temp 99.3°F | Wt 150.0 lb

## 2018-07-16 DIAGNOSIS — R6889 Other general symptoms and signs: Secondary | ICD-10-CM | POA: Diagnosis not present

## 2018-07-16 DIAGNOSIS — J101 Influenza due to other identified influenza virus with other respiratory manifestations: Secondary | ICD-10-CM | POA: Diagnosis not present

## 2018-07-16 LAB — POCT INFLUENZA A/B: Influenza B, POC: POSITIVE — AB

## 2018-07-16 MED ORDER — OSELTAMIVIR PHOSPHATE 75 MG PO CAPS: 75.0000 mg | ORAL_CAPSULE | Freq: Two times a day (BID) | ORAL | 0 refills | Status: DC

## 2018-07-16 NOTE — Telephone Encounter (Signed)
Pt's mom called with the patient having c/o body aches, fever, some nausea, cough. She took her temp this morning it was 104.7 by using a temporal thermometer.  Pt stated that she was wheezing but mom stated she did not hear that. She started running a low grade temp on Monday night. Pt received the flu vaccine.  Notified flow at [facility] regarding an appointment for today. Appointment scheduled. Advised mom to call back for increase in symptoms or respiratory distress. She voiced understanding. Routing to flow at [facility] at [place].  Reason for Disposition . Fever > 104 F (40 C)  Answer Assessment - Initial Assessment Questions 1. TEMPERATURE: "What is the most recent temperature?"  "How was it measured?"      104.7 forehead 2. ONSET: "When did the fever start?"      Started low grade on Monday night 3. SYMPTOMS: "Do you have any other symptoms besides the fever?"  (e.g., colds, headache, sore throat, earache, cough, rash, diarrhea, vomiting, abdominal pain)     Body aches, sore throat, cough, some nausea 4. CAUSE: If there are no symptoms, ask: "What do you think is causing the fever?"      Possibly flu 5. CONTACTS: "Does anyone else in the family have an infection?"     no 6. TREATMENT: "What have you done so far to treat this fever?" (e.g., medications)     Ibuprofen, nightquell  7. IMMUNOCOMPROMISE: "Do you have of the following: diabetes, HIV positive, splenectomy, cancer chemotherapy, chronic steroid treatment, transplant patient, etc."     no 8. PREGNANCY: "Is there any chance you are pregnant?" "When was your last menstrual period?"     No LMP right nokw 9. TRAVEL: "Have you traveled out of the country in the last month?" (e.g., travel history, exposures)     no  Protocols used: FEVER-A-AH

## 2018-07-16 NOTE — Telephone Encounter (Signed)
Noted  

## 2018-07-16 NOTE — Progress Notes (Signed)
Chief Complaint  Patient presents with  . Cough    body aches, high fever this morning, runny nose , chills . Since monday taking ibuprofen and nyquil     HPI: Carol Conrad 22 y.o.  sda   Monday night.  36 hours or less onset with fever  Then runny nose and cough  And bodyaches  Had lfu avccine this year  hh parents and  Grandparents  In 2880s   No cp sob  Local pain  ROS: See pertinent positives and negatives per HPI.  Past Medical History:  Diagnosis Date  . Goiter    borderlne abn tests  following  . Vitiligo    derm evaluation using herval remedy [place] oral and topical    Family History  Problem Relation Age of Onset  . Eczema Father   . Hypertension Father   . Hyperlipidemia Father        Elevated triglycerides  . Eczema Sister   . Hypertension Maternal Grandmother     Social History   Socioeconomic History  . Marital status: Single    Spouse name: Not on file  . Number of children: Not on file  . Years of education: Not on file  . Highest education level: Not on file  Occupational History  . Not on file  Social Needs  . Financial resource strain: Not on file  . Food insecurity:    Worry: Not on file    Inability: Not on file  . Transportation needs:    Medical: Not on file    Non-medical: Not on file  Tobacco Use  . Smoking status: Never Smoker  . Smokeless tobacco: Never Used  Substance and Sexual Activity  . Alcohol use: No  . Drug use: Not on file  . Sexual activity: Never  Lifestyle  . Physical activity:    Days per week: Not on file    Minutes per session: Not on file  . Stress: Not on file  Relationships  . Social connections:    Talks on phone: Not on file    Gets together: Not on file    Attends religious service: Not on file    Active member of club or organization: Not on file    Attends meetings of clubs or organizations: Not on file    Relationship status: Not on file  Other Topics Concern  . 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   Parents Anu and Angela BurkeSrikanth   Masters level edu father Art gallery managerengineer    Outpatient Medications Prior to Visit  Medication Sig Dispense Refill  . Cholecalciferol 50000 units TABS Take 1 tablet by mouth once a week. 12 tablet 0  . Diclofenac Sodium (PENNSAID) 2 % SOLN Place 1 application onto the skin 2 (two) times daily. 112 g 2  . LO LOESTRIN FE 1 MG-10 MCG / 10 MCG tablet      No facility-administered medications prior to visit.      EXAM:  BP 118/60 (BP Location: Right Arm, Patient Position: Sitting, Cuff Size: Normal)   Pulse (!) 120   Temp 99.3 F (37.4 C) (Oral)   Wt 150 lb (68 kg)   SpO2 99%   BMI 24.96 kg/m   Body mass index is 24.96  kg/m.  GENERAL: vitals reviewed and listed above, alert, oriented, appears well hydrated and in no acute distress stuffy nose    HEENT: atraumatic, conjunctiva  clear, no obvious abnormalities on inspection of external nose and ears tm clear   Nares  Clear mucous OP : no lesion edema or exudate  NECK: no obvious masses on inspection palpation supple LUNGS: clear to auscultation bilaterally, no wheezes, rales or rhonchi, good air movement CV: HRRR, no clubbing cyanosis or  peripheral edema nl cap refill  Abdomen:  Sof,t normal bowel sounds without hepatosplenomegaly, no guarding rebound or masses no CVA tenderness MS: moves all extremities without noticeable focal  abnormality Skin: normal capillary refill ,turgor , color: No acute rashes ,petechiae or bruising   ASSESSMENT AND PLAN:  Discussed the following assessment and plan:  Influenza B - appear uncomlicated  disc med and supportive care   Flu-like symptoms - Plan: POC Influenza A/B   Expectant management.       Risk benefit of medication discussed.   -Patient advised to return or notify  health care team  if symptoms worsen ,persist or new concerns arise.  Patient Instructions      Influenza, Adult Influenza, more commonly known as "the flu," is a viral infection that mainly affects the respiratory tract. The respiratory tract includes organs that help you breathe, such as the lungs, nose, and throat. The flu causes many symptoms similar to the common cold along with high fever and body aches. The flu spreads easily from person to person (is contagious). Getting a flu shot (influenza vaccination) every year is the best way to prevent the flu. What are the causes? This condition is caused by the influenza virus. You can get the virus by:  Breathing in droplets that are in the air from an infected person's cough or sneeze.  Touching something that has been exposed to the virus (has been contaminated) and then touching your mouth, nose, or eyes. What increases the risk? The following factors may make you more likely to get the flu:  Not washing or sanitizing your hands often.  Having close contact with many people during cold and flu season.  Touching your mouth, eyes, or nose without first washing or sanitizing your hands.  Not getting a yearly (annual) flu shot. You may have a higher risk for the flu, including serious problems such as a lung infection (pneumonia), if you:  Are older than 65.  Are pregnant.  Have a weakened disease-fighting system (immune system). You may have a weakened immune system if you: ? Have HIV or AIDS. ? Are undergoing chemotherapy. ? Are taking medicines that reduce (suppress) the activity of your immune system.  Have a long-term (chronic) illness, such as heart disease, kidney disease, diabetes, or lung disease.  Have a liver disorder.  Are severely overweight (morbidly obese).  Have anemia. This is a condition that affects your red blood cells.  Have asthma. What are the signs or symptoms? Symptoms of this condition usually  begin suddenly and last 4-14 days. They may include:  Fever and chills.  Headaches, body aches, or muscle aches.  Sore throat.  Cough.  Runny or stuffy (congested) nose.  Chest discomfort.  Poor appetite.  Weakness or fatigue.  Dizziness.  Nausea or vomiting. How is this diagnosed? This condition may be diagnosed based on:  Your symptoms and medical history.  A physical exam.  Swabbing your nose or throat and testing the fluid for the influenza virus. How is  this treated? If the flu is diagnosed early, you can be treated with medicine that can help reduce how severe the illness is and how long it lasts (antiviral medicine). This may be given by mouth (orally) or through an IV. Taking care of yourself at home can help relieve symptoms. Your health care provider may recommend:  Taking over-the-counter medicines.  Drinking plenty of fluids. In many cases, the flu goes away on its own. If you have severe symptoms or complications, you may be treated in a hospital. Follow these instructions at home: Activity  Rest as needed and get plenty of sleep.  Stay home from work or school as told by your health care provider. Unless you are visiting your health care provider, avoid leaving home until your fever has been gone for 24 hours without taking medicine. Eating and drinking  Take an oral rehydration solution (ORS). This is a drink that is sold at pharmacies and retail stores.  Drink enough fluid to keep your urine pale yellow.  Drink clear fluids in small amounts as you are able. Clear fluids include water, ice chips, diluted fruit juice, and low-calorie sports drinks.  Eat bland, easy-to-digest foods in small amounts as you are able. These foods include bananas, applesauce, rice, lean meats, toast, and crackers.  Avoid drinking fluids that contain a lot of sugar or caffeine, such as energy drinks, regular sports drinks, and soda.  Avoid alcohol.  Avoid spicy or  fatty foods. General instructions      Take over-the-counter and prescription medicines only as told by your health care provider.  Use a cool mist humidifier to add humidity to the air in your home. This can make it easier to breathe.  Cover your mouth and nose when you cough or sneeze.  Wash your hands with soap and water often, especially after you cough or sneeze. If soap and water are not available, use alcohol-based hand sanitizer.  Keep all follow-up visits as told by your health care provider. This is important. How is this prevented?   Get an annual flu shot. You may get the flu shot in late summer, fall, or winter. Ask your health care provider when you should get your flu shot.  Avoid contact with people who are sick during cold and flu season. This is generally fall and winter. Contact a health care provider if:  You develop new symptoms.  You have: ? Chest pain. ? Diarrhea. ? A fever.  Your cough gets worse.  You produce more mucus.  You feel nauseous or you vomit. Get help right away if:  You develop shortness of breath or difficulty breathing.  Your skin or nails turn a bluish color.  You have severe pain or stiffness in your neck.  You develop a sudden headache or sudden pain in your face or ear.  You cannot eat or drink without vomiting. Summary  Influenza, more commonly known as "the flu," is a viral infection that primarily affects your respiratory tract.  Symptoms of the flu usually begin suddenly and last 4-14 days.  Getting an annual flu shot is the best way to prevent getting the flu.  Stay home from work or school as told by your health care provider. Unless you are visiting your health care provider, avoid leaving home until your fever has been gone for 24 hours without taking medicine.  Keep all follow-up visits as told by your health care provider. This is important. This information is not intended to replace advice  given to you by  your health care provider. Make sure you discuss any questions you have with your health care provider. Document Released: 06/22/2000 Document Revised: 12/11/2017 Document Reviewed: 12/11/2017 Elsevier Interactive Patient Education  2019 [employer].     [place]. Xena Propst M.D.

## 2018-07-16 NOTE — Patient Instructions (Signed)

## 2018-07-17 ENCOUNTER — Other Ambulatory Visit: Payer: Self-pay | Admitting: Internal Medicine

## 2018-07-17 ENCOUNTER — Telehealth: Payer: Self-pay | Admitting: *Deleted

## 2018-07-17 DIAGNOSIS — J101 Influenza due to other identified influenza virus with other respiratory manifestations: Secondary | ICD-10-CM

## 2018-07-17 MED ORDER — ONDANSETRON 4 MG PO TBDP: 4.0000 mg | ORAL_TABLET | Freq: Three times a day (TID) | ORAL | 0 refills | Status: DC | PRN

## 2018-07-17 NOTE — Telephone Encounter (Signed)
Pt seen 07/16/18, diagnosed with flu Pt now having vomiting and unable to keep anything down.  Requesting recommendations - something to help with nausea/vomiting.   Please advise Dr Ardyth HarpsHernandez as Dr Fabian SharpPanosh is not in office. Thanks.   [facility] [address]

## 2018-07-17 NOTE — Telephone Encounter (Signed)
Have sent 10 Zofran tabs to pharmacy.

## 2018-07-17 NOTE — Telephone Encounter (Signed)
Mother is calling to report patient has started vomiting now- she is having trouble keeping anything down. Patient reports she started vomiting this morning. She feels she may need something to help with nausea. Patient lives with her mother- mother has called the office for tamiflu and is reporting patient vomiting.

## 2018-07-18 NOTE — Telephone Encounter (Signed)
Left detailed message on machine making aware that Rx has been sent to pharmacy. Advised call back if any questions.  

## 2018-08-01 ENCOUNTER — Encounter: Payer: Self-pay | Admitting: Sports Medicine

## 2018-08-01 ENCOUNTER — Ambulatory Visit (INDEPENDENT_AMBULATORY_CARE_PROVIDER_SITE_OTHER): Payer: BLUE CROSS/BLUE SHIELD | Admitting: Sports Medicine

## 2018-08-01 VITALS — BP 98/70 | HR 83 | Ht 65.0 in | Wt 144.4 lb

## 2018-08-01 DIAGNOSIS — M9902 Segmental and somatic dysfunction of thoracic region: Secondary | ICD-10-CM | POA: Diagnosis not present

## 2018-08-01 DIAGNOSIS — R0781 Pleurodynia: Secondary | ICD-10-CM

## 2018-08-01 DIAGNOSIS — M9908 Segmental and somatic dysfunction of rib cage: Secondary | ICD-10-CM | POA: Diagnosis not present

## 2018-08-01 DIAGNOSIS — M9901 Segmental and somatic dysfunction of cervical region: Secondary | ICD-10-CM | POA: Diagnosis not present

## 2018-08-01 DIAGNOSIS — E559 Vitamin D deficiency, unspecified: Secondary | ICD-10-CM

## 2018-08-01 DIAGNOSIS — Q678 Other congenital deformities of chest: Secondary | ICD-10-CM

## 2018-08-01 NOTE — Progress Notes (Signed)
Carol Conrad. [name] [phone]  Carol Conrad - 23 y.o. female MRN [id number]  Date of birth: 1995/10/11  Visit Date: August 01, 2018  PCP: Madelin Headings, MD   Referred by: Madelin Headings, MD  SUBJECTIVE:  Chief Complaint  Patient presents with  . Middle Back - Follow-up    Stiffness. Taking IBU or Tylenol prn. Taking OCT Vit D. HEP almost daily except over the past month.   . pain of the ribs    Has responded well to OMT.     HPI: Patient reports recurrent right upper/pain that has actually worsened over the past several days with worsening weather but still this time well.  She has recently flew and thinks this may have exacerbated her symptoms as well.  She is feeling better in general.  She is taking vitamin D over-the-counter.  REVIEW OF SYSTEMS: Per HPI  HISTORY:  Prior history reviewed and updated per electronic medical record.  Social History   Occupational History  . Not on file  Tobacco Use  . Smoking status: Never Smoker  . Smokeless tobacco: Never Used  Substance and Sexual Activity  . Alcohol use: No  . Drug use: Not on file  . 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 Moved from [place] 2012   Parents Carol and Carol Conrad   Masters level edu father Art gallery manager    OBJECTIVE:  VS:  HT:5\' 5"  (165.1 cm)   WT:144 lb 6.4 oz (65.5 kg)  BMI:24.03    BP:98/70  HR:83bpm  TEMP: ( )  RESP:99 %   PHYSICAL EXAM: Adult female.  Thoracic spine has limited rotation to the right muscle spasms on the left region.  Limited sidebending bending the right.  Posture is improved compared to the past but she still has an anterior chain dominance.   ASSESSMENT  1. Rib pain   2. Somatic dysfunction of cervical region     3. Somatic dysfunction of thoracic region   4. Somatic dysfunction of rib cage region   5. Asymmetrical thorax   6. Vitamin D deficiency      PROCEDURES:  PROCEDURE NOTE: OSTEOPATHIC MANIPULATION   The decision today to treat with Osteopathic Manipulative Therapy (OMT) was based on physical exam findings. Verbal consent was obtained following a discussion with the patient regarding the of risks, benefits and potential side effects, including an acute pain flare,post manipulation soreness and need for repeat treatments.  NONE  Manipulation was performed as below:  [facility] & Osteopathic Exam Findings   CERVICAL SPINE:   OA - rotated right C4 Extended, rotated RIGHT, sidebent LEFT THORACIC SPINE:    T2 - 4 Neutral, rotated LEFT, sidebent RIGHT T6 - 8 Neutral, rotated RIGHT, sidebent LEFT RIBS:   Rib 7Right  Posterior    OMT Techniques Used:   HVLA muscle energy myofascial release    The patient tolerated the treatment well and reported Improved symptoms following treatment today. Patient was given medications, exercises, stretches and lifestyle modifications per AVS and verbally.       PLAN:  Pertinent additional documentation may be included in corresponding procedure notes, imaging studies, problem based documentation and patient instructions.  She is responded well  to osteopathic manipulation and this was repeated again today.  She will continue with the home therapeutic exercises previously prescribed.  Continue with over-the-counter vitamin D supplementation and we will plan to recheck this in 3 to 6 months given how well she had with the spine and to the high-dose 12-week course, there is a low likelihood of this being significantly low at this time.  Activity modifications and the importance of avoiding exacerbating activities (limiting pain to no more than a 4 / 10 during or following activity) recommended and discussed. Discussed red flag symptoms that warrant  earlier emergent evaluation and patient voices understanding. No orders of the defined types were placed in this encounter. Lab Orders  No laboratory test(s) ordered today   Imaging Orders  No imaging studies ordered today   Referral Orders  No referral(s) requested today     Return if symptoms worsen or fail to improve.          Andrena MewsMichael D Rigby, DO    [facility]

## 2019-01-13 NOTE — Progress Notes (Signed)
Chief Complaint  Patient presents with  . Annual Exam    Pt has no concerns today     HPI: Patient  Carol Conrad  23 y.o. comes in today for Preventive Health Care visit   In  Person  To attend [facility].  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  Doing well no co today .   Health Maintenance  Topic Date Due  . HIV Screening  03/18/2010  . TETANUS/TDAP  03/18/2014  . PAP-Cervical Cytology Screening  01/14/2020 (Originally 03/18/2016)  . INFLUENZA VACCINE  02/07/2019  . PAP SMEAR-Modifier  01/07/2020   Health Maintenance Review LIFESTYLE:  Exercise:   Hour every day. Knee pain often  After running but no acute problems  Tobacco/ETS: no Alcohol:  None recently  Sugar beverages: no Sleep: 8 hours  Drug use: no HH of  6   No pets  Work:/School  To live in  [place] home  Last pap  Last summer  Ok     ROS:  GEN/ HEENT: No fever, significant weight changes sweats headaches vision problems hearing changes, CV/ PULM; No chest pain shortness of breath cough, syncope,edema  change in exercise tolerance. GI /GU: No adominal pain, vomiting, change in bowel habits. No blood in the stool. No significant GU symptoms. Periods normal SKIN/HEME: ,no acute skin rashes suspicious lesions or bleeding. No lymphadenopathy, nodules, masses.  NEURO/ PSYCH:  No neurologic signs such as weakness numbness. No depression anxiety. IMM/ Allergy: No unusual infections.  Allergy .   REST of 12 system review negative except as per HPI   Past Medical History:  Diagnosis Date  . Goiter    borderlne abn tests  following  . Vitiligo    derm evaluation using herval remedy [place] oral and topical    Past Surgical History:  Procedure Laterality Date  . ADENOIDECTOMY  Age 77  . ANKLE SURGERY  4th Grade   left navicular     Family History  Problem Relation Age of Onset  . Eczema Father   . Hypertension Father   . Hyperlipidemia Father        Elevated triglycerides  . Eczema  Sister   . Hypertension Maternal Grandmother     Social History   Socioeconomic History  . Marital status: Single    Spouse name: Not on file  . Number of children: Not on file  . Years of education: Not on file  . Highest education level: Not on file  Occupational History  . Not on file  Social Needs  . Financial resource strain: Not on file  . Food insecurity    Worry: Not on file    Inability: Not on file  . Transportation needs    Medical: Not on file    Non-medical: Not on file  Tobacco Use  . Smoking status: Never Smoker  . Smokeless tobacco: Never Used  Substance and Sexual Activity  . Alcohol use: No  . Drug use: Not on file  . Sexual activity: Never  Lifestyle  . Physical activity    Days per week: Not on file    Minutes per session: Not on file  . Stress: Not on file  Relationships  . Social Herbalist on phone: Not on file    Gets together: Not on file    Attends religious service: Not on file    Active member of club or organization: Not on file    Attends meetings of clubs or  organizations: Not on file    Relationship status: Not on file  Other Topics Concern  . 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 Moved from [place]   Parents Anu and Johny Chess   Masters level edu father Chief Financial Officer    Outpatient Medications Prior to Visit  Medication Sig Dispense Refill  . VITAMIN D PO Take by mouth.     No facility-administered medications prior to visit.      EXAM:  BP 110/62 (BP Location: Right Arm, Patient Position: Sitting, Cuff Size: Normal)   Pulse 76   Temp 98.7 F (37.1 C) (Oral)   Ht 5' 5"  (1.651 m)   Wt 140 lb 3.2 oz (63.6 kg)   LMP 12/24/2018   SpO2 99%   BMI 23.33 kg/m   Body mass index is 23.33 kg/m. Wt Readings from Last 3 Encounters:  01/14/19 140 lb 3.2 oz (63.6  kg)  08/01/18 144 lb 6.4 oz (65.5 kg)  07/16/18 150 lb (68 kg)    Physical Exam: Vital signs reviewed PPI:RJJO is a well-developed well-nourished alert cooperative    who appearsr stated age in no acute distress.  HEENT: normocephalic atraumatic , Eyes: PERRL EOM's full, conjunctiva clear, Nares: paten,t no deformity discharge or tenderness., Ears: no deformity EAC's clear TMs with normal landmarks. Mouth: clear OP, no lesions, edema.  Moist mucous membranes. Dentition in adequate repair. NECK: supple without masses, thyroid palpable no bruits. CHEST/PULM:  Clear to auscultation and percussion breath sounds equal no wheeze , rales or rhonchi. No chest wall deformities or tenderness.Breast: normal by inspection . No dimpling, discharge, masses, tenderness or discharge . CV: PMI is nondisplaced, S1 S2 no gallops, murmurs, rubs. Peripheral pulses are full without delay.No JVD .  ABDOMEN: Bowel sounds normal nontender  No guard or rebound, no hepato splenomegal no CVA tenderness.   Extremtities:  No clubbing cyanosis or edema, no acute joint swelling or redness no focal atrophy NEURO:  Oriented x3, cranial nerves 3-12 appear to be intact, no obvious focal weakness,gait within normal limits no abnormal reflexes or asymmetrical SKIN: No acute rashes normal turgor, color, no bruising or petechiae. PSYCH: Oriented, good eye contact, no obvious depression anxiety, cognition and judgment appear normal. LN: no cervical axillary inguinal adenopathy  Lab Results  Component Value Date   WBC 8.3 05/13/2017   HGB 12.5 05/13/2017   HCT 38.1 05/13/2017   PLT 257.0 05/13/2017   GLUCOSE 83 05/13/2017   CHOL 151 05/13/2017   TRIG 134.0 05/13/2017   HDL 46.80 05/13/2017   LDLCALC 77 05/13/2017   ALT 17 05/13/2017   AST 15 05/13/2017   NA 137 05/13/2017   K 4.4 05/13/2017   CL 101 05/13/2017   CREATININE 0.60 05/13/2017   BUN 8 05/13/2017   CO2 28 05/13/2017   TSH 1.42 05/13/2017   HGBA1C 5.1  02/26/2012    BP Readings from Last 3 Encounters:  01/14/19 110/62  08/01/18 98/70  07/16/18 118/60    Lab plan reviewed with patient   ASSESSMENT AND PLAN:  Discussed the following assessment and plan:    ICD-10-CM   1. Visit for preventive health examination  A41.66 Basic metabolic panel    CBC with Differential/Platelet    Lipid panel    TSH    T4, free  2. Immunity status testing  Z01.84 Hepatitis B surface antibody,quantitative  3. Screening-pulmonary TB  Z11.1 QuantiFERON-TB Gold Plus  4. Need for Tdap vaccination  Z23 Tdap vaccine greater than or equal to 7yo IM  immuniz review  utd x tdap  Hep b titer and quantiferon.  Lipids thyroid    Bmp cbc today ( fasting)   Plan yearly visit if in area  Patient Care Team: Keeton Kassebaum, Standley Brooking, MD as PCP - General (Internal Medicine) Lelon Huh, MD (Pediatric Endocrinology) Patient Instructions  tdap and labs today  Will be notified of results .  Good luck in school. Let us know if  We need to help in anyway  Via My chart .    Preventive Care 109-58 Years Old, Female Preventive care refers to visits with your health care provider and lifestyle choices that can promote health and wellness. This includes:  A yearly physical exam. This may also be called an annual well check.  Regular dental visits and eye exams.  Immunizations.  Screening for certain conditions.  Healthy lifestyle choices, such as eating a healthy diet, getting regular exercise, not using drugs or products that contain nicotine and tobacco, and limiting alcohol use. What can I expect for my preventive care visit? Physical exam Your health care provider will check your:  Height and weight. This may be used to calculate body mass index (BMI), which tells if you are at a healthy weight.  Heart rate and blood pressure.  Skin for abnormal spots. Counseling Your health care provider may ask you questions about your:  Alcohol, tobacco, and drug use.   Emotional well-being.  Home and relationship well-being.  Sexual activity.  Eating habits.  Work and work Statistician.  Method of birth control.  Menstrual cycle.  Pregnancy history. What immunizations do I need?  Influenza (flu) vaccine  This is recommended every year. Tetanus, diphtheria, and pertussis (Tdap) vaccine  You may need a Td booster every 10 years. Varicella (chickenpox) vaccine  You may need this if you have not been vaccinated. Human papillomavirus (HPV) vaccine  If recommended by your health care provider, you may need three doses over 6 months. Measles, mumps, and rubella (MMR) vaccine  You may need at least one dose of MMR. You may also need a second dose. Meningococcal conjugate (MenACWY) vaccine  One dose is recommended if you are age 63-21 years and a first-year college student living in a residence hall, or if you have one of several medical conditions. You may also need additional booster doses. Pneumococcal conjugate (PCV13) vaccine  You may need this if you have certain conditions and were not previously vaccinated. Pneumococcal polysaccharide (PPSV23) vaccine  You may need one or two doses if you smoke cigarettes or if you have certain conditions. Hepatitis A vaccine  You may need this if you have certain conditions or if you travel or work in places where you may be exposed to hepatitis A. Hepatitis B vaccine  You may need this if you have certain conditions or if you travel or work in places where you may be exposed to hepatitis B. Haemophilus influenzae type b (Hib) vaccine  You may need this if you have certain conditions. You may receive vaccines as individual doses or as more than one vaccine together in one shot (combination vaccines). Talk with your health care provider about the risks and benefits of combination vaccines. What tests do I need?  Blood tests  Lipid and cholesterol levels. These may be  checked every 5 years  starting at age 61.  Hepatitis C test.  Hepatitis B test. Screening  Diabetes screening. This is done by checking your blood sugar (glucose) after you have not eaten for a while (fasting).  Sexually transmitted disease (STD) testing.  BRCA-related cancer screening. This may be done if you have a family history of breast, ovarian, tubal, or peritoneal cancers.  Pelvic exam and Pap test. This may be done every 3 years starting at age 31. Starting at age 102, this may be done every 5 years if you have a Pap test in combination with an HPV test. Talk with your health care provider about your test results, treatment options, and if necessary, the need for more tests. Follow these instructions at home: Eating and drinking   Eat a diet that includes fresh fruits and vegetables, whole grains, lean protein, and low-fat dairy.  Take vitamin and mineral supplements as recommended by your health care provider.  Do not drink alcohol if: ? Your health care provider tells you not to drink. ? You are pregnant, may be pregnant, or are planning to become pregnant.  If you drink alcohol: ? Limit how much you have to 0-1 drink a day. ? Be aware of how much alcohol is in your drink. In the [place] one drink equals one 12 oz bottle of beer (355 mL), one 5 oz glass of wine (148 mL), or one 1 oz glass of hard liquor (44 mL). Lifestyle  Take daily care of your teeth and gums.  Stay active. Exercise for at least 30 minutes on 5 or more days each week.  Do not use any products that contain nicotine or tobacco, such as cigarettes, e-cigarettes, and chewing tobacco. If you need help quitting, ask your health care provider.  If you are sexually active, practice safe sex. Use a condom or other form of birth control (contraception) in order to prevent pregnancy and STIs (sexually transmitted infections). If you plan to become pregnant, see your health care provider for a preconception visit. What's next?   Visit your health care provider once a year for a well check visit.  Ask your health care provider how often you should have your eyes and teeth checked.  Stay up to date on all vaccines. This information is not intended to replace advice given to you by your health care provider. Make sure you discuss any questions you have with your health care provider. Document Released: 08/21/2001 Document Revised: 03/06/2018 Document Reviewed: 03/06/2018 Elsevier Patient Education  2020 [place] Nakota Ackert M.D.

## 2019-01-14 ENCOUNTER — Other Ambulatory Visit: Payer: Self-pay

## 2019-01-14 ENCOUNTER — Encounter: Payer: Self-pay | Admitting: Internal Medicine

## 2019-01-14 ENCOUNTER — Ambulatory Visit (INDEPENDENT_AMBULATORY_CARE_PROVIDER_SITE_OTHER): Payer: BC Managed Care – PPO | Admitting: Internal Medicine

## 2019-01-14 VITALS — BP 110/62 | HR 76 | Temp 98.7°F | Ht 65.0 in | Wt 140.2 lb

## 2019-01-14 DIAGNOSIS — Z111 Encounter for screening for respiratory tuberculosis: Secondary | ICD-10-CM | POA: Diagnosis not present

## 2019-01-14 DIAGNOSIS — Z0184 Encounter for antibody response examination: Secondary | ICD-10-CM | POA: Diagnosis not present

## 2019-01-14 DIAGNOSIS — Z23 Encounter for immunization: Secondary | ICD-10-CM

## 2019-01-14 DIAGNOSIS — Z0000 Encounter for general adult medical examination without abnormal findings: Secondary | ICD-10-CM | POA: Diagnosis not present

## 2019-01-14 LAB — BASIC METABOLIC PANEL
BUN: 9 mg/dL (ref 6–23)
CO2: 25 mEq/L (ref 19–32)
Calcium: 9.5 mg/dL (ref 8.4–10.5)
Chloride: 101 mEq/L (ref 96–112)
Creatinine, Ser: 0.7 mg/dL (ref 0.40–1.20)
GFR: 102.96 mL/min (ref >60.00)
Glucose, Bld: 73 mg/dL (ref 70–99)
Potassium: 4.4 mEq/L (ref 3.5–5.1)
Sodium: 136 mEq/L (ref 135–145)

## 2019-01-14 LAB — CBC WITH DIFFERENTIAL/PLATELET
Basophils Absolute: 0 10*3/uL (ref 0.0–0.1)
Basophils Relative: 0.6 % (ref 0.0–3.0)
Eosinophils Absolute: 0.2 10*3/uL (ref 0.0–0.7)
Eosinophils Relative: 2.6 % (ref 0.0–5.0)
HCT: 38.7 % (ref 36.0–46.0)
Hemoglobin: 13.2 g/dL (ref 12.0–15.0)
Lymphocytes Relative: 33.6 % (ref 12.0–46.0)
Lymphs Abs: 2.5 10*3/uL (ref 0.7–4.0)
MCHC: 34 g/dL (ref 30.0–36.0)
MCV: 85.9 fl (ref 78.0–100.0)
Monocytes Absolute: 0.5 10*3/uL (ref 0.1–1.0)
Monocytes Relative: 6.8 % (ref 3.0–12.0)
Neutro Abs: 4.3 10*3/uL (ref 1.4–7.7)
Neutrophils Relative %: 56.4 % (ref 43.0–77.0)
Platelets: 255 10*3/uL (ref 150.0–400.0)
RBC: 4.51 Mil/uL (ref 3.87–5.11)
RDW: 13.1 % (ref 11.5–15.5)
WBC: 7.6 10*3/uL (ref 4.0–10.5)

## 2019-01-14 LAB — LIPID PANEL
Cholesterol: 159 mg/dL (ref 0–200)
HDL: 43.4 mg/dL (ref >39.00)
LDL Cholesterol: 86 mg/dL (ref 0–99)
NonHDL: 115.69
Total CHOL/HDL Ratio: 4
Triglycerides: 148 mg/dL (ref 0.0–149.0)
VLDL: 29.6 mg/dL (ref 0.0–40.0)

## 2019-01-14 LAB — T4, FREE: Free T4: 0.83 ng/dL (ref 0.60–1.60)

## 2019-01-14 LAB — TSH: TSH: 1.18 u[IU]/mL (ref 0.35–4.50)

## 2019-01-14 NOTE — Patient Instructions (Addendum)
tdap and labs today  Will be notified of results .  Good luck in school. Let us know if  We need to help in anyway  Via My chart .    Preventive Care 10-24 Years Old, Female Preventive care refers to visits with your health care provider and lifestyle choices that can promote health and wellness. This includes:  A yearly physical exam. This may also be called an annual well check.  Regular dental visits and eye exams.  Immunizations.  Screening for certain conditions.  Healthy lifestyle choices, such as eating a healthy diet, getting regular exercise, not using drugs or products that contain nicotine and tobacco, and limiting alcohol use. What can I expect for my preventive care visit? Physical exam Your health care provider will check your:  Height and weight. This may be used to calculate body mass index (BMI), which tells if you are at a healthy weight.  Heart rate and blood pressure.  Skin for abnormal spots. Counseling Your health care provider may ask you questions about your:  Alcohol, tobacco, and drug use.  Emotional well-being.  Home and relationship well-being.  Sexual activity.  Eating habits.  Work and work Statistician.  Method of birth control.  Menstrual cycle.  Pregnancy history. What immunizations do I need?  Influenza (flu) vaccine  This is recommended every year. Tetanus, diphtheria, and pertussis (Tdap) vaccine  You may need a Td booster every 10 years. Varicella (chickenpox) vaccine  You may need this if you have not been vaccinated. Human papillomavirus (HPV) vaccine  If recommended by your health care provider, you may need three doses over 6 months. Measles, mumps, and rubella (MMR) vaccine  You may need at least one dose of MMR. You may also need a second dose. Meningococcal conjugate (MenACWY) vaccine  One dose is recommended if you are age 72-21 years and a first-year college student living in a residence hall, or if you  have one of several medical conditions. You may also need additional booster doses. Pneumococcal conjugate (PCV13) vaccine  You may need this if you have certain conditions and were not previously vaccinated. Pneumococcal polysaccharide (PPSV23) vaccine  You may need one or two doses if you smoke cigarettes or if you have certain conditions. Hepatitis A vaccine  You may need this if you have certain conditions or if you travel or work in places where you may be exposed to hepatitis A. Hepatitis B vaccine  You may need this if you have certain conditions or if you travel or work in places where you may be exposed to hepatitis B. Haemophilus influenzae type b (Hib) vaccine  You may need this if you have certain conditions. You may receive vaccines as individual doses or as more than one vaccine together in one shot (combination vaccines). Talk with your health care provider about the risks and benefits of combination vaccines. What tests do I need?  Blood tests  Lipid and cholesterol levels. These may be checked every 5 years starting at age 28.  Hepatitis C test.  Hepatitis B test. Screening  Diabetes screening. This is done by checking your blood sugar (glucose) after you have not eaten for a while (fasting).  Sexually transmitted disease (STD) testing.  BRCA-related cancer screening. This may be done if you have a family history of breast, ovarian, tubal, or peritoneal cancers.  Pelvic exam and Pap test. This may be done every 3 years starting at age 78. Starting at age 61, this may be done  every 5 years if you have a Pap test in combination with an HPV test. Talk with your health care provider about your test results, treatment options, and if necessary, the need for more tests. Follow these instructions at home: Eating and drinking   Eat a diet that includes fresh fruits and vegetables, whole grains, lean protein, and low-fat dairy.  Take vitamin and mineral supplements  as recommended by your health care provider.  Do not drink alcohol if: ? Your health care provider tells you not to drink. ? You are pregnant, may be pregnant, or are planning to become pregnant.  If you drink alcohol: ? Limit how much you have to 0-1 drink a day. ? Be aware of how much alcohol is in your drink. In the [place] one drink equals one 12 oz bottle of beer (355 mL), one 5 oz glass of wine (148 mL), or one 1 oz glass of hard liquor (44 mL). Lifestyle  Take daily care of your teeth and gums.  Stay active. Exercise for at least 30 minutes on 5 or more days each week.  Do not use any products that contain nicotine or tobacco, such as cigarettes, e-cigarettes, and chewing tobacco. If you need help quitting, ask your health care provider.  If you are sexually active, practice safe sex. Use a condom or other form of birth control (contraception) in order to prevent pregnancy and STIs (sexually transmitted infections). If you plan to become pregnant, see your health care provider for a preconception visit. What's next?  Visit your health care provider once a year for a well check visit.  Ask your health care provider how often you should have your eyes and teeth checked.  Stay up to date on all vaccines. This information is not intended to replace advice given to you by your health care provider. Make sure you discuss any questions you have with your health care provider. Document Released: 08/21/2001 Document Revised: 03/06/2018 Document Reviewed: 03/06/2018 Elsevier Patient Education  2020 [employer].

## 2019-01-16 LAB — QUANTIFERON-TB GOLD PLUS
Mitogen-NIL: 9.78 IU/mL
NIL: 0.03 IU/mL
QuantiFERON-TB Gold Plus: NEGATIVE
TB1-NIL: 0.01 IU/mL
TB2-NIL: 0.01 IU/mL

## 2019-01-16 LAB — HEPATITIS B SURFACE ANTIBODY, QUANTITATIVE: Hepatitis B-Post: 114 m[IU]/mL (ref >=10)

## 2019-06-29 ENCOUNTER — Other Ambulatory Visit: Payer: BC Managed Care – PPO

## 2019-06-30 ENCOUNTER — Ambulatory Visit: Payer: BC Managed Care – PPO | Attending: Internal Medicine

## 2019-06-30 DIAGNOSIS — Z20828 Contact with and (suspected) exposure to other viral communicable diseases: Secondary | ICD-10-CM | POA: Diagnosis not present

## 2019-06-30 DIAGNOSIS — Z20822 Contact with and (suspected) exposure to covid-19: Secondary | ICD-10-CM

## 2019-07-02 LAB — NOVEL CORONAVIRUS, NAA: SARS-CoV-2, NAA: NOT DETECTED

## 2019-07-24 ENCOUNTER — Ambulatory Visit: Payer: BC Managed Care – PPO | Attending: Internal Medicine

## 2019-07-24 DIAGNOSIS — Z20822 Contact with and (suspected) exposure to covid-19: Secondary | ICD-10-CM

## 2019-07-25 LAB — NOVEL CORONAVIRUS, NAA: SARS-CoV-2, NAA: NOT DETECTED

## 2019-08-01 IMAGING — DX DG CHEST 2V
2 series · 2 of 2 positions shown · non-contrast
Comparison: None.

CLINICAL DATA: Acute onset of mid and LEFT-sided chest pain and
tightness last week that lasted approximately 1-2 days, subsequently
resolved. Asymmetric palpable lump in the LEFT UPPER abdomen/LEFT
LOWER chest wall location.

EXAM:
CHEST - 2 VIEW

[chest pa]
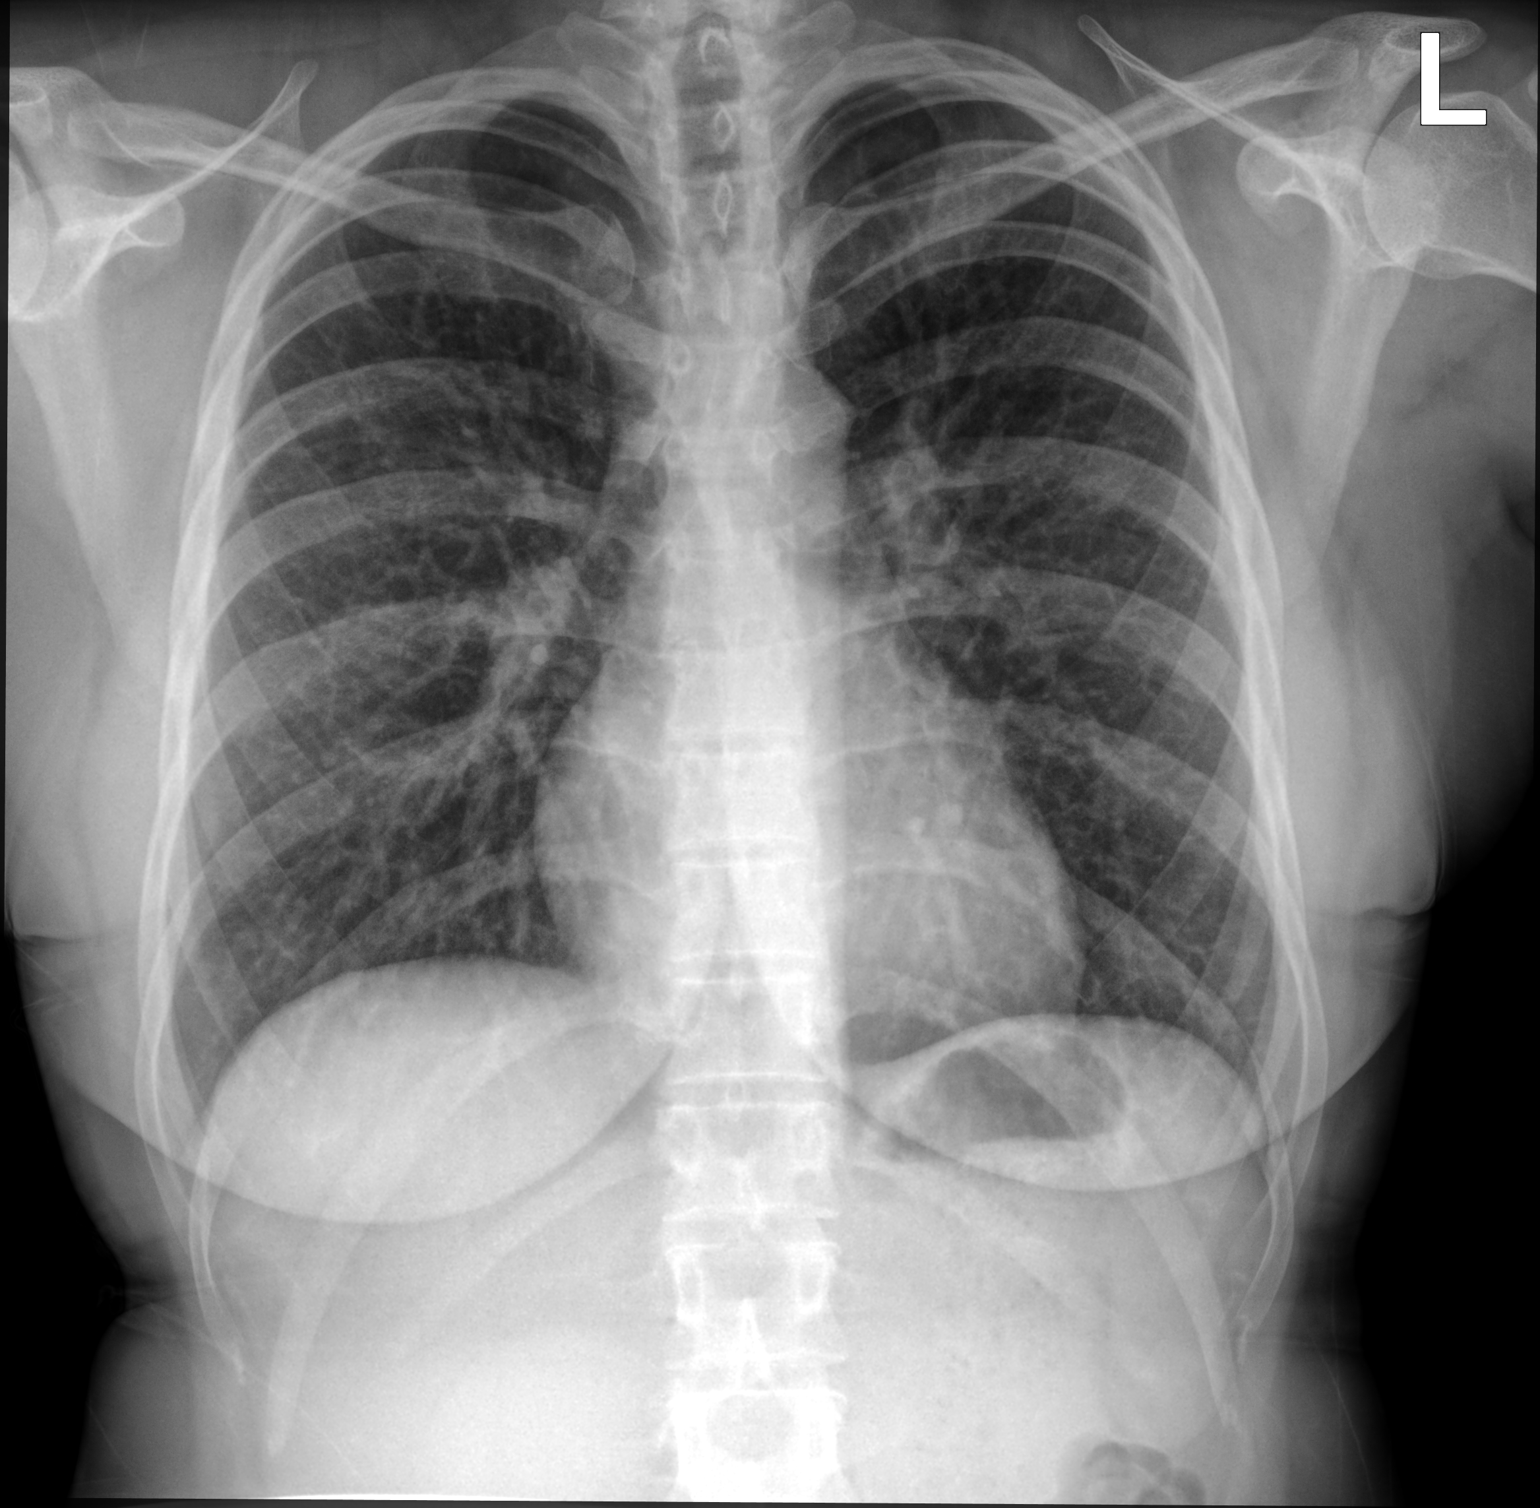

[chest lat]
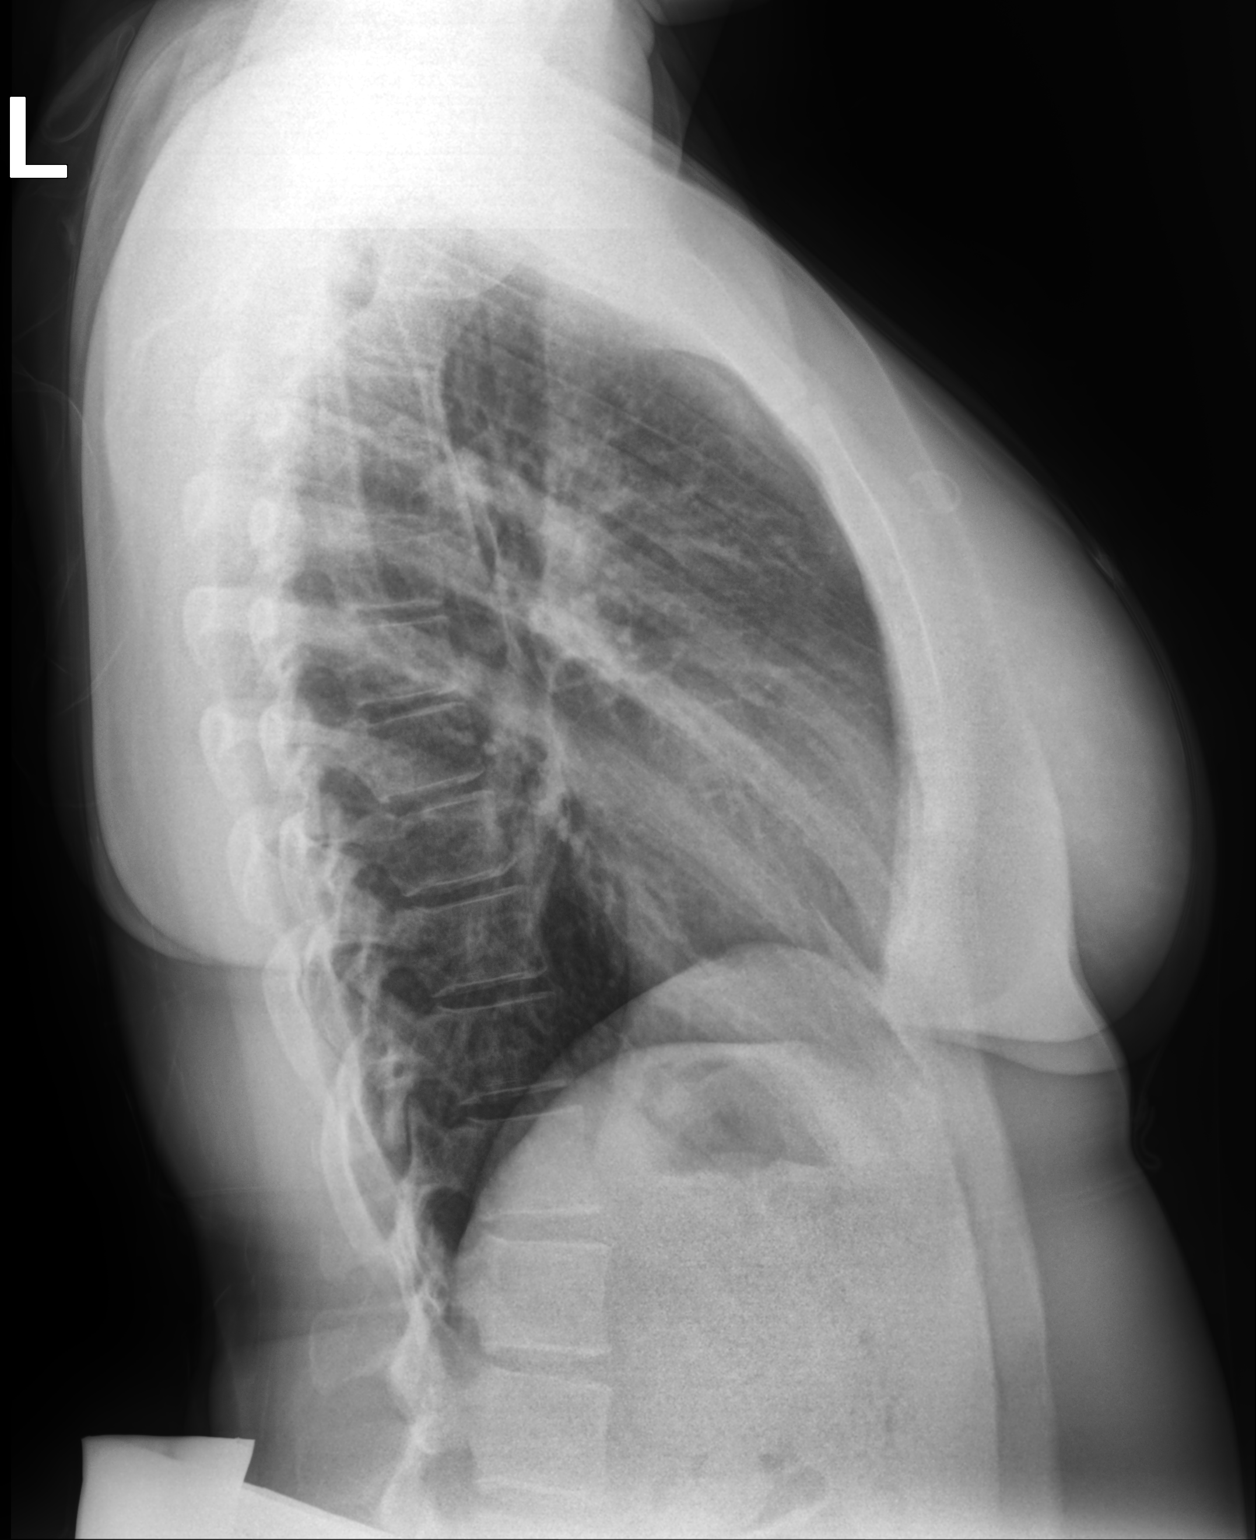

[2 of 2 positions shown; findings below may reference images not displayed]

FINDINGS: Cardiomediastinal silhouette unremarkable. Apparent diffusely
prominent bronchovascular markings is felt to be related to
radiographic technique, as there is no significant central
peribronchial thickening. Lungs clear. Bronchovascular markings
normal. Pulmonary vascularity normal. No visible pleural effusions.
No pneumothorax. Visualized bony thorax intact.
IMPRESSION: No acute cardiopulmonary disease.

## 2019-11-25 ENCOUNTER — Ambulatory Visit (INDEPENDENT_AMBULATORY_CARE_PROVIDER_SITE_OTHER): Payer: BC Managed Care – PPO | Admitting: Family Medicine

## 2019-11-25 ENCOUNTER — Other Ambulatory Visit: Payer: Self-pay

## 2019-11-25 ENCOUNTER — Encounter: Payer: Self-pay | Admitting: Family Medicine

## 2019-11-25 ENCOUNTER — Ambulatory Visit: Payer: Self-pay

## 2019-11-25 ENCOUNTER — Ambulatory Visit (INDEPENDENT_AMBULATORY_CARE_PROVIDER_SITE_OTHER)
Admission: RE | Admit: 2019-11-25 | Discharge: 2019-11-25 | Disposition: A | Discharge status: Home / Self Care | Payer: BC Managed Care – PPO | Source: Ambulatory Visit | Attending: Family Medicine | Admitting: Family Medicine

## 2019-11-25 VITALS — BP 102/70 | HR 83 | Ht 65.0 in | Wt 136.8 lb

## 2019-11-25 DIAGNOSIS — M25572 Pain in left ankle and joints of left foot: Secondary | ICD-10-CM

## 2019-11-25 DIAGNOSIS — M7989 Other specified soft tissue disorders: Secondary | ICD-10-CM | POA: Diagnosis not present

## 2019-11-25 NOTE — Patient Instructions (Addendum)
Thank you for coming in today. Do the exercises we discussed.  Use compression sleeve on the ankle.  Body Helix full ankle sleeve is really helpful.  Use over the counter voltaren gel up to 4x daily.  If needed I can arrange for a DVT ultrasound in [place]. Let me know where and when.  Get xray today.   If not better can also arrange for MRI here or in [place].   Keep me updated.  Mychart message are really good for this.   Please perform the exercise program that we have prepared for you and gone over in detail on a daily basis.  In addition to the handout you were provided you can access your program through: [email or web address]   Your unique program code is:  [id number]  I recommend you obtained a compression sleeve to help with your joint problems. There are many options on the market however I recommend obtaining a ankle Body Helix compression sleeve.  You can find information (including how to appropriate measure yourself for sizing) can be found at [email or web address] [email or web address].ch.  Many of these products are health savings account (HSA) eligible.   You can use the compression sleeve at any time throughout the day but is most important to use while being active as well as for 2 hours post-activity.   It is appropriate to ice following activity with the compression sleeve in place.

## 2019-11-25 NOTE — Progress Notes (Signed)
Subjective:    CC: L ankle pain  I, Carol Conrad, LAT, ATC, am serving as scribe for Dr. Clementeen Graham.  HPI: Pt is a 24 y/o female presenting w/ c/o L ankle pain x 3 weeks.  She locates her pain to her L lateral ankle.  She states that she rolled her L ankle about 3 weeks ago but didn't notice pain and swelling until about 1-1.5 weeks later.  She rates her pain as mild-mod and describes her pain as shooting at it's worst.  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  No history of recent immobility or calf swelling.  No personal or family history of DVT.  No personal family history of rheumatologic disease.  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  History of recurrent ankle sprains and instability. 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  She is back down here in [place] this week for her sister's college graduation.  She will be back to [place] at the end of the week.  Objective:    Vitals:   11/25/19 1038  BP: 102/70  Pulse: 83  SpO2: 99%   General: Well Developed, well nourished, and in no acute distress.   MSK: Left ankle mild effusion otherwise normal-appearing Not particularly tender to palpation. Slight laxity to talar tilt and anterior drawer testing. Intact strength. Pulses capillary fill and sensation intact distally..  No calf swelling or palpable cords.  Nontender.  No calf erythema.  Lab and Radiology Results  X-ray images left ankle obtained today personally and independently reviewed Normal-appearing without acute fracture obvious  osteochondral changes or significant degenerative changes. Await formal radiology review  Diagnostic Limited MSK Ultrasound of: Left ankle Achilles tendon normal-appearing no retrocalcaneal bursa present. Posterior tibialis tendon and peroneal tendons normal appearing Small joint effusion present at lateral subtalar joint. Small amount of subcutaneous hypoechoic fluid tracking subcutaneous tissue consistent with edema Bony structures normal appearing otherwise Impression: Edema and mild subtalar joint effusion    Impression and Recommendations:    Assessment and Plan: 24 y.o. female with left ankle pain and swelling.  History of recurrent ankle sprains.  That likely explains her current pain today.  I am concerned that she may have more significant injury not well seen on today's x-ray or previous x-rays.  Plan for compressive ankle sleeve and home exercise program and Voltaren gel.  If not better in 4 to 6 weeks would consider MRI.  This could be arranged to be done up in [place] where she is living.  Additionally there is a very small possibility that her swelling could be due to DVT.  Clinically she does not have a good history for DVT and her physical exam is not consistent with DVT.  However if worsening or not improving would consider vascular ultrasound.  Additionally I think DVT is very unlikely as she is not on any thrombogenic medications including oral contraception.  Patient will keep me updated.  Determine next steps based on symptoms.  [id number]; 15 additional minutes spent for Therapeutic exercises as stated in above notes.  This included exercises focusing on stretching, strengthening, with significant focus on  eccentric aspects.   Long term goals include an improvement in range of motion, strength, endurance as well as avoiding reinjury. Patient's frequency would include in 1-2 times a day, 3-5 times a week for a duration of 6-12 weeks.  Proper technique shown and discussed handout  in great detail with ATC.  All questions were discussed and answered.    PDMP not reviewed this encounter. Orders Placed This Encounter  Procedures  . [place] LIMITED JOINT SPACE STRUCTURES LOW LEFT(NO LINKED CHARGES)    Order Specific Question:   Reason for Exam (SYMPTOM  OR DIAGNOSIS REQUIRED)    Answer:   L lateral ankle pain    Order Specific Question:   Preferred imaging location?    Answer:   [facility]  . DG Ankle Complete Left    Standing Status:   Future    Number of Occurrences:   1    Standing Expiration Date:   01/24/2021    Order Specific Question:   Reason for Exam (SYMPTOM  OR DIAGNOSIS REQUIRED)    Answer:   eval ankle pain and effusion    Order Specific Question:   Is patient pregnant?    Answer:   No    Order Specific Question:   Preferred imaging location?    Answer:   [facility]    Order Specific Question:   Radiology Contrast Protocol - do NOT remove file path    Answer:   [email or web address]   No orders of the defined types were placed in this encounter.   Discussed warning signs or symptoms. Please see discharge instructions. Patient expresses understanding.   The above documentation has been reviewed and is accurate and complete Lynne Leader, M.D.

## 2019-11-26 NOTE — Progress Notes (Signed)
Ankle x-ray shows some soft tissue swelling but otherwise looks normal.

## 2019-11-27 ENCOUNTER — Ambulatory Visit: Payer: BC Managed Care – PPO | Admitting: Family Medicine

## 2019-12-18 ENCOUNTER — Encounter: Payer: Self-pay | Admitting: Family Medicine

## 2020-04-19 HISTORY — PX: OVARIAN CYST REMOVAL: SHX89

## 2020-07-13 ENCOUNTER — Ambulatory Visit (INDEPENDENT_AMBULATORY_CARE_PROVIDER_SITE_OTHER): Payer: BC Managed Care – PPO

## 2020-07-13 ENCOUNTER — Encounter: Payer: Self-pay | Admitting: Internal Medicine

## 2020-07-13 ENCOUNTER — Ambulatory Visit (INDEPENDENT_AMBULATORY_CARE_PROVIDER_SITE_OTHER): Payer: BC Managed Care – PPO | Admitting: Internal Medicine

## 2020-07-13 ENCOUNTER — Other Ambulatory Visit: Payer: Self-pay

## 2020-07-13 VITALS — BP 100/50 | HR 86 | Temp 98.8°F | Ht 65.0 in | Wt 132.2 lb

## 2020-07-13 DIAGNOSIS — R5383 Other fatigue: Secondary | ICD-10-CM | POA: Diagnosis not present

## 2020-07-13 DIAGNOSIS — Z8616 Personal history of COVID-19: Secondary | ICD-10-CM

## 2020-07-13 DIAGNOSIS — R062 Wheezing: Secondary | ICD-10-CM | POA: Diagnosis not present

## 2020-07-13 NOTE — Progress Notes (Signed)
Chief Complaint  Patient presents with  . Wheezing    HPI: Carol Conrad 25 y.o. come in for problem of sx  After having covid infection in November 2021 This was after triple vaccination.  She is home from school for winter break.  Her symptoms lasted minimally with congestion fatigue loss of smell for perhaps 1 day minimal cough with chest congestion.  Did not require other intervention. Ever since then she gets intermittent 1 to 2-day episodes of what she calls wheezing which is like a rattle in her mid chest like mucus perhaps wheezing.  She has no history of asthma or wheezing but does have a history of upper respiratory minor allergic symptoms for which he takes Zyrtec. The symptoms are more often at night but have been in the morning recently when she tries to take breaths she knows that hot shower helps  Exercise  Runs  And  And strengths exercise and there are no symptoms . No upper but chest congestion.   Neck pain  Other .  No chest pain or shortness of breath or unusual coughing. Is having fatigue does not know if it is from illness but would like her thyroid and anemia check. ROS: See pertinent positives and negatives per HPI.  Past Medical History:  Diagnosis Date  . Goiter    borderlne abn tests  following  . Vitiligo    derm evaluation using herval remedy [place] oral and topical    Family History  Problem Relation Age of Onset  . Eczema Father   . Hypertension Father   . Hyperlipidemia Father        Elevated triglycerides  . Eczema Sister   . Hypertension Maternal Grandmother     Social History   Socioeconomic History  . Marital status: Single    Spouse name: Not on file  . Number of children: Not on file  . Years of education: Not on file  . Highest education level: Not on file  Occupational History  . Not on file  Tobacco Use  . Smoking status: Never Smoker  . Smokeless tobacco: Never Used  Substance and Sexual Activity  . Alcohol use: No   . Drug use: Not on file  . Sexual activity: Never  Other Topics Concern  . 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 Moved from [place] 2012   Parents Anu and Angela Burke   Masters level edu father Art gallery manager   Social Determinants of Corporate investment banker Strain: Not on [employer] Insecurity: Not on file  Transportation Needs: Not on file  Physical Activity: Not on file  Stress: Not on file  Social Connections: Not on file    Outpatient Medications Prior to Visit  Medication Sig Dispense Refill  . VITAMIN D PO Take by mouth.     No facility-administered medications prior to visit.     EXAM:  BP (!) 100/50 (BP Location: Left Arm, Patient Position: Sitting, Cuff Size: Normal)   Pulse 86   Temp 98.8 F (37.1 C) (Oral)   Ht 5\' 5"  (1.651 m)   Wt 132 lb 3.2 oz (60 kg)   SpO2 99%   BMI 22.00 kg/m   Body mass index is 22 kg/m.  GENERAL: vitals reviewed and listed above, alert, oriented,  appears well hydrated and in no acute distress HEENT: atraumatic, conjunctiva  clear, no obvious abnormalities on inspection of external nose and ears OP : masked  NECK: no obvious masses on inspection palpation  LUNGS: clear to auscultation bilaterally, no wheezes, rales or rhonchi, good air movement CV: HRRR, no clubbing cyanosis or  peripheral edema nl cap refill  Abdomen:  Sof,t normal bowel sounds without hepatosplenomegaly, no guarding rebound or masses no CVA tenderness MS: moves all extremities without noticeable focal  abnormality PSYCH: pleasant and cooperative, no obvious depression or anxiety Lab Results  Component Value Date   WBC 7.6 01/14/2019   HGB 13.2 01/14/2019   HCT 38.7 01/14/2019   PLT 255.0 01/14/2019   GLUCOSE 73 01/14/2019   CHOL 159 01/14/2019   TRIG 148.0 01/14/2019   HDL 43.40 01/14/2019   LDLCALC 86  01/14/2019   ALT 17 05/13/2017   AST 15 05/13/2017   NA 136 01/14/2019   K 4.4 01/14/2019   CL 101 01/14/2019   CREATININE 0.70 01/14/2019   BUN 9 01/14/2019   CO2 25 01/14/2019   TSH 1.18 01/14/2019   HGBA1C 5.1 02/26/2012   BP Readings from Last 3 Encounters:  07/13/20 (!) 100/50  11/25/19 102/70  01/14/19 110/62    ASSESSMENT AND PLAN:  Discussed the following assessment and plan:  Wheeze - Plan: DG Chest 2 View, CBC with Differential/Platelet, TSH, T4, free, Comprehensive metabolic panel, C-reactive protein, CBC with Differential/Platelet, Comprehensive metabolic panel, C-reactive protein, T4, free, TSH  History of COVID-19 - Plan: DG Chest 2 View, CBC with Differential/Platelet, TSH, T4, free, Comprehensive metabolic panel, C-reactive protein, CBC with Differential/Platelet, Comprehensive metabolic panel, C-reactive protein, T4, free, TSH  Other fatigue - Plan: CBC with Differential/Platelet, TSH, T4, free, Comprehensive metabolic panel, C-reactive protein, CBC with Differential/Platelet, Comprehensive metabolic panel, C-reactive protein, T4, free, TSH Looks well exam is benign symptoms intermittent respiratory not alarming post Covid but chest x-ray indicated based on scenario. Fatigue could certainly be post Covid and schedule. Plan check for anemia thyroid metabolic dysfunction rule out. If all normal will follow follow-up with alarm symptoms in the respiratory area or if in 2 to 3 months not back to baseline get reevaluated. -Patient advised to return or notify health care team  if  new concerns arise.  Patient Instructions  Exam is normal today .reassuring.  Will notify you  of labs when available.   If all ok  Follow      Carol Conrad. Carol Conrad M.D.

## 2020-07-13 NOTE — Progress Notes (Signed)
Good news no major lung abnormalities read as minimal bronchitic changes which really means some streakiness thickening of the bronchial tubes.  This could be residual from the Covid infection.No masses infiltrates or fluid.  Continued observation over the next few months Awaiting blood work.

## 2020-07-13 NOTE — Patient Instructions (Addendum)
Exam is normal today .reassuring.  Will notify you  of labs when available.   If all ok  Follow

## 2020-07-14 LAB — CBC WITH DIFFERENTIAL/PLATELET
Basophils Absolute: 0.1 10*3/uL (ref 0.0–0.1)
Basophils Relative: 1.4 % (ref 0.0–3.0)
Eosinophils Absolute: 0.3 10*3/uL (ref 0.0–0.7)
Eosinophils Relative: 3.5 % (ref 0.0–5.0)
HCT: 37.6 % (ref 36.0–46.0)
Hemoglobin: 12.6 g/dL (ref 12.0–15.0)
Lymphocytes Relative: 28.2 % (ref 12.0–46.0)
Lymphs Abs: 2.3 10*3/uL (ref 0.7–4.0)
MCHC: 33.6 g/dL (ref 30.0–36.0)
MCV: 86.8 fl (ref 78.0–100.0)
Monocytes Absolute: 0.6 10*3/uL (ref 0.1–1.0)
Monocytes Relative: 6.7 % (ref 3.0–12.0)
Neutro Abs: 5 10*3/uL (ref 1.4–7.7)
Neutrophils Relative %: 60.2 % (ref 43.0–77.0)
Platelets: 272 10*3/uL (ref 150.0–400.0)
RBC: 4.33 Mil/uL (ref 3.87–5.11)
RDW: 13.4 % (ref 11.5–15.5)
WBC: 8.3 10*3/uL (ref 4.0–10.5)

## 2020-07-14 LAB — T4, FREE: Free T4: 0.86 ng/dL (ref 0.60–1.60)

## 2020-07-14 LAB — COMPREHENSIVE METABOLIC PANEL
ALT: 7 U/L (ref 0–35)
AST: 12 U/L (ref 0–37)
Albumin: 4.5 g/dL (ref 3.5–5.2)
Alkaline Phosphatase: 63 U/L (ref 39–117)
BUN: 15 mg/dL (ref 6–23)
CO2: 28 mEq/L (ref 19–32)
Calcium: 9.4 mg/dL (ref 8.4–10.5)
Chloride: 102 mEq/L (ref 96–112)
Creatinine, Ser: 0.7 mg/dL (ref 0.40–1.20)
GFR: 120.29 mL/min (ref >60.00)
Glucose, Bld: 90 mg/dL (ref 70–99)
Potassium: 4.3 mEq/L (ref 3.5–5.1)
Sodium: 137 mEq/L (ref 135–145)
Total Bilirubin: 0.4 mg/dL (ref 0.2–1.2)
Total Protein: 7.4 g/dL (ref 6.0–8.3)

## 2020-07-14 LAB — C-REACTIVE PROTEIN: CRP: <1 mg/dL (ref 0.5–20.0)

## 2020-07-14 LAB — TSH: TSH: 0.82 u[IU]/mL (ref 0.35–4.50)

## 2020-07-14 NOTE — Progress Notes (Signed)
Mychart message sent: Good news no major lung abnormalities read as minimal bronchitic changes which really means some streakiness thickening of the bronchial tubes. This could be residual from the Covid infection.No masses infiltrates or fluid. Continued observation over the next few months  Awaiting blood work.

## 2020-07-15 NOTE — Progress Notes (Signed)
Blood count thyroid and  chemistry are all normal . Inflammation marker is new .  All reassuring  . Follow  as planned  and reassess if  persistent or progressive

## 2020-07-15 NOTE — Progress Notes (Signed)
Mychart message sent: Blood count thyroid and  chemistry are all normal . Inflammation marker is new .  All reassuring  . Follow  as planned  and reassess if  persistent or progressive

## 2021-02-20 ENCOUNTER — Encounter: Payer: BC Managed Care – PPO | Admitting: Internal Medicine

## 2021-03-01 DIAGNOSIS — R87810 Cervical high risk human papillomavirus (HPV) DNA test positive: Secondary | ICD-10-CM | POA: Diagnosis not present

## 2021-03-01 DIAGNOSIS — N898 Other specified noninflammatory disorders of vagina: Secondary | ICD-10-CM | POA: Diagnosis not present

## 2021-03-01 DIAGNOSIS — Z118 Encounter for screening for other infectious and parasitic diseases: Secondary | ICD-10-CM | POA: Diagnosis not present

## 2021-03-01 DIAGNOSIS — Z6822 Body mass index (BMI) 22.0-22.9, adult: Secondary | ICD-10-CM | POA: Diagnosis not present

## 2021-03-01 DIAGNOSIS — Z114 Encounter for screening for human immunodeficiency virus [HIV]: Secondary | ICD-10-CM | POA: Diagnosis not present

## 2021-03-01 DIAGNOSIS — Z01419 Encounter for gynecological examination (general) (routine) without abnormal findings: Secondary | ICD-10-CM | POA: Diagnosis not present

## 2021-03-01 DIAGNOSIS — Z1159 Encounter for screening for other viral diseases: Secondary | ICD-10-CM | POA: Diagnosis not present

## 2021-03-01 DIAGNOSIS — Z124 Encounter for screening for malignant neoplasm of cervix: Secondary | ICD-10-CM | POA: Diagnosis not present

## 2021-03-01 DIAGNOSIS — Z113 Encounter for screening for infections with a predominantly sexual mode of transmission: Secondary | ICD-10-CM | POA: Diagnosis not present

## 2022-01-15 IMAGING — DX DG CHEST 2V
2 series · 2 of 2 positions shown · non-contrast
Comparison: 01/27/2018

CLINICAL DATA: Intermittent wheezing, history [id number] in May 2020

EXAM:
CHEST - 2 VIEW

[chest pa]
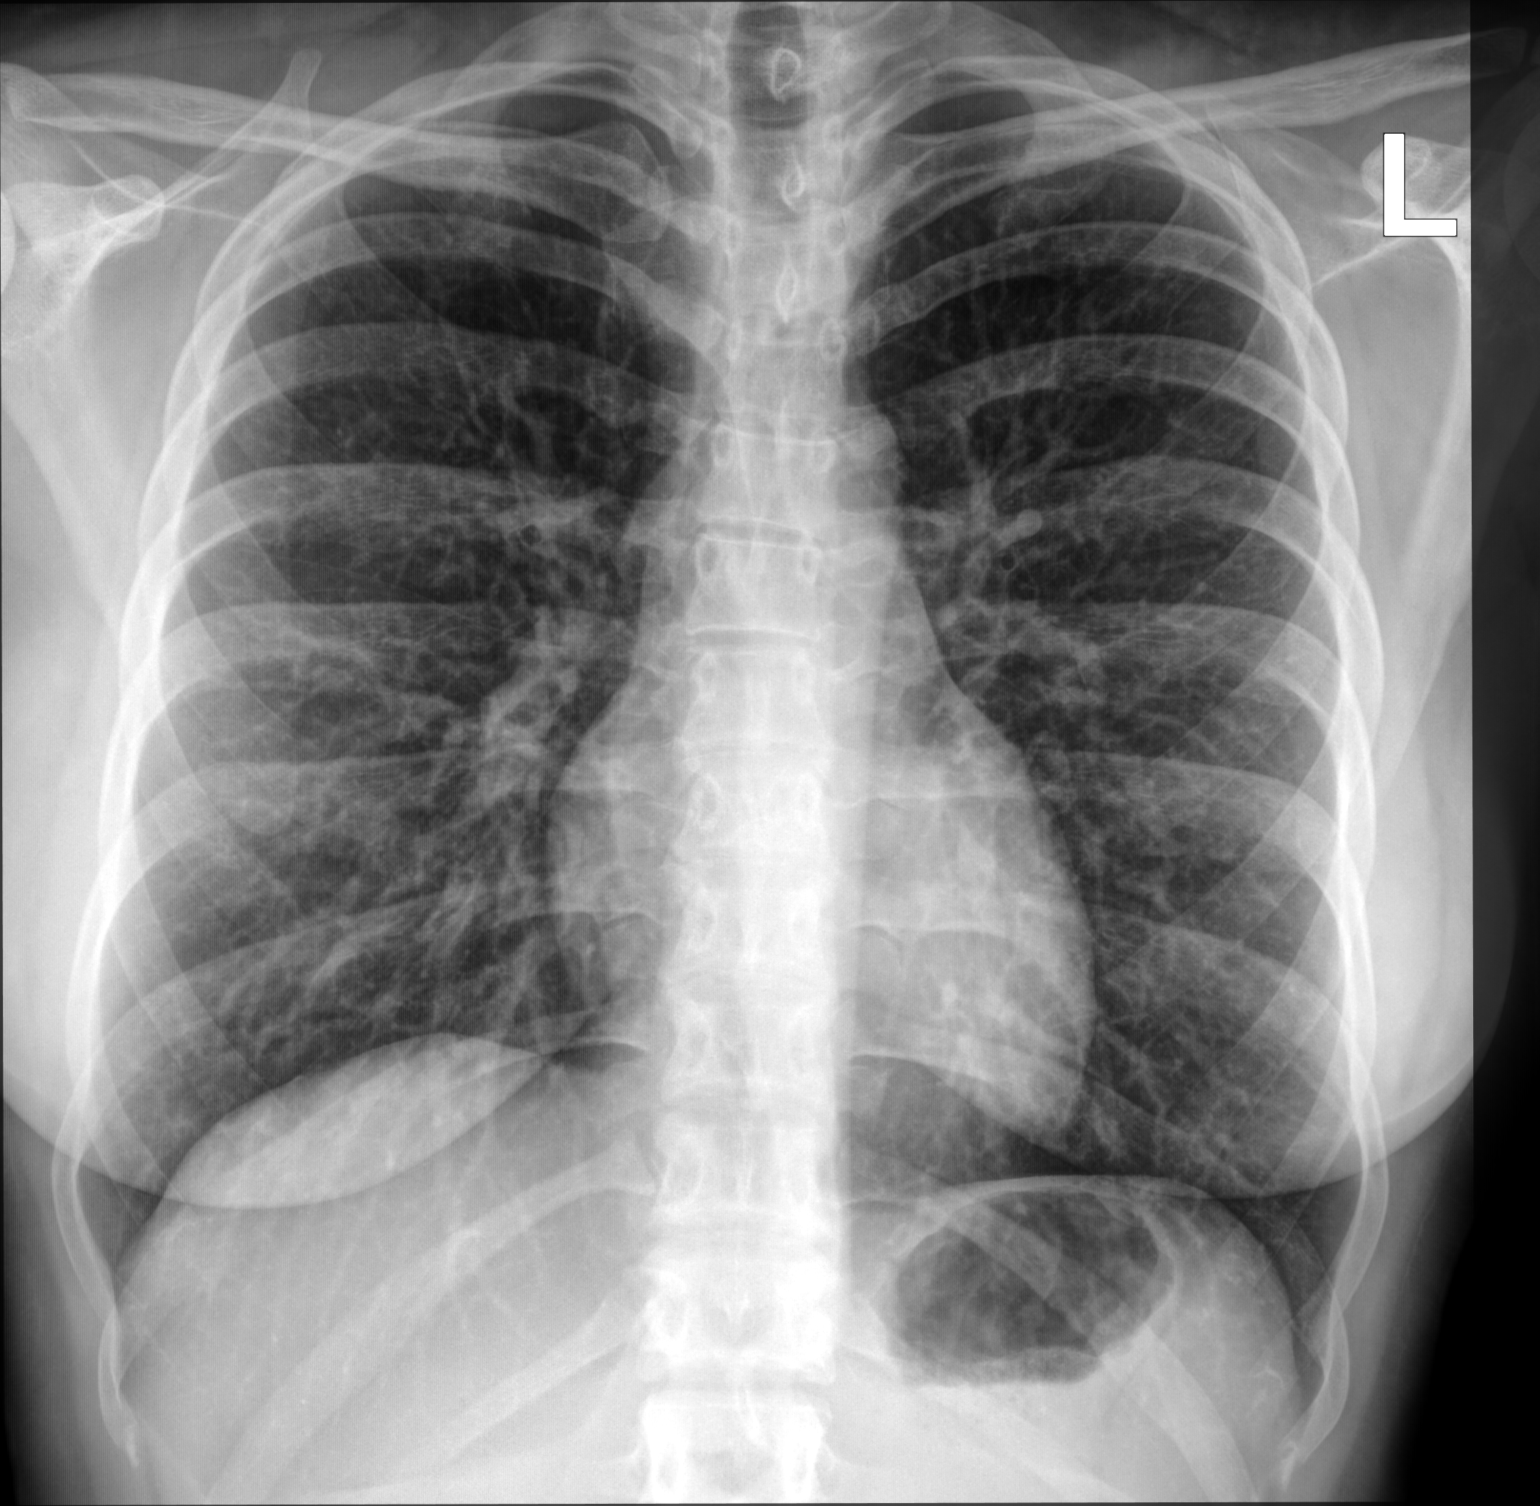

[chest lat]
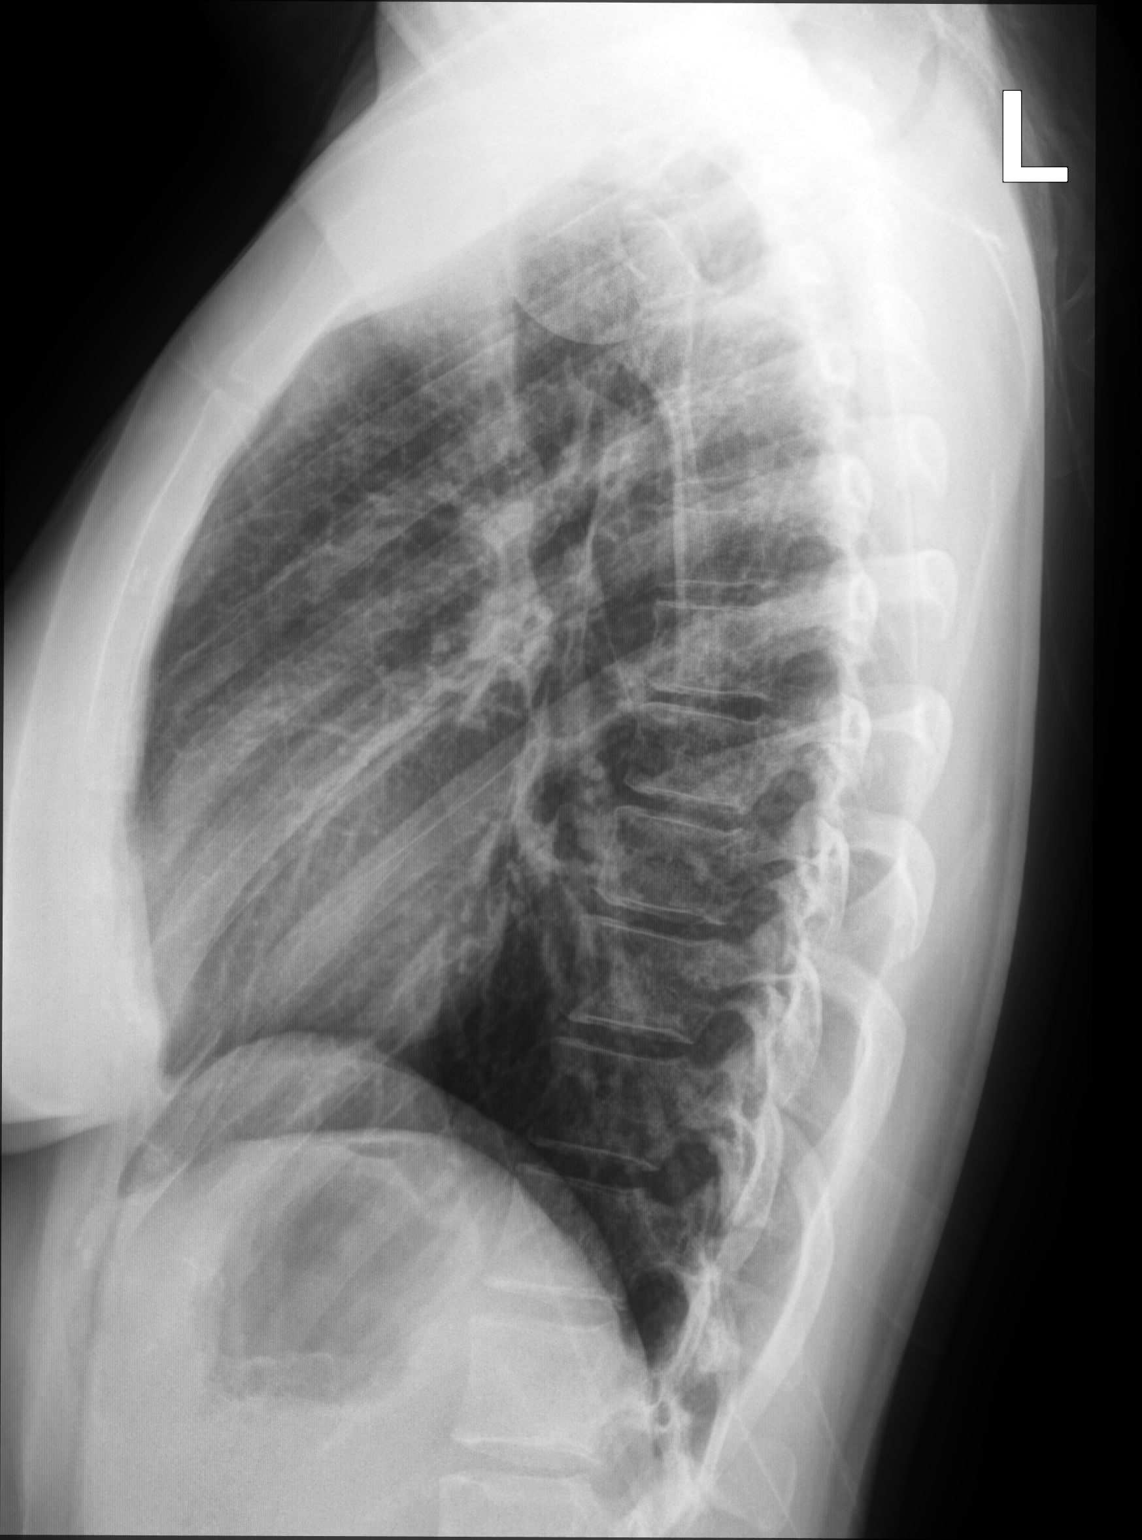

[2 of 2 positions shown; findings below may reference images not displayed]

FINDINGS: Normal heart size, mediastinal contours, and pulmonary vascularity.

Minimal peribronchial thickening.

No pulmonary infiltrate, pleural effusion, or pneumothorax.

Bones unremarkable.
IMPRESSION: Minimal bronchitic changes without infiltrate.

## 2023-07-17 ENCOUNTER — Telehealth: Payer: Self-pay | Admitting: Internal Medicine

## 2023-07-17 NOTE — Telephone Encounter (Signed)
 Called pt to sch physical, left vm. Pls sch pt for appt.
